# Patient Record
Sex: Female | Born: 1985 | Race: Black or African American | Hispanic: Refuse to answer | Marital: Married | Smoking: Never smoker
Health system: Southern US, Community
[De-identification: ages and names within clinical notes are randomized; demographics above are authoritative.]

## PROBLEM LIST (undated history)

## (undated) ENCOUNTER — Inpatient Hospital Stay (HOSPITAL_COMMUNITY): Payer: Self-pay

## (undated) DIAGNOSIS — L309 Dermatitis, unspecified: Secondary | ICD-10-CM

## (undated) DIAGNOSIS — K219 Gastro-esophageal reflux disease without esophagitis: Secondary | ICD-10-CM

## (undated) DIAGNOSIS — T7840XA Allergy, unspecified, initial encounter: Secondary | ICD-10-CM

## (undated) DIAGNOSIS — F419 Anxiety disorder, unspecified: Secondary | ICD-10-CM

## (undated) DIAGNOSIS — G43909 Migraine, unspecified, not intractable, without status migrainosus: Secondary | ICD-10-CM

## (undated) DIAGNOSIS — J302 Other seasonal allergic rhinitis: Secondary | ICD-10-CM

## (undated) HISTORY — DX: Other seasonal allergic rhinitis: J30.2

## (undated) HISTORY — DX: Allergy, unspecified, initial encounter: T78.40XA

## (undated) HISTORY — DX: Migraine, unspecified, not intractable, without status migrainosus: G43.909

## (undated) HISTORY — DX: Dermatitis, unspecified: L30.9

## (undated) HISTORY — DX: Anxiety disorder, unspecified: F41.9

## (undated) HISTORY — DX: Gastro-esophageal reflux disease without esophagitis: K21.9

---
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
## 2007-11-01 ENCOUNTER — Emergency Department (HOSPITAL_COMMUNITY): Admission: EM | Admit: 2007-11-01 | Discharge: 2007-11-01 | Payer: Self-pay | Admitting: Emergency Medicine

## 2008-08-07 ENCOUNTER — Emergency Department (HOSPITAL_COMMUNITY): Admission: EM | Admit: 2008-08-07 | Discharge: 2008-08-07 | Payer: Self-pay | Admitting: Family Medicine

## 2010-04-12 ENCOUNTER — Emergency Department (HOSPITAL_COMMUNITY): Admission: EM | Admit: 2010-04-12 | Discharge: 2010-04-12 | Payer: Self-pay | Admitting: Emergency Medicine

## 2011-02-10 NOTE — Op Note (Signed)
NAMEMEKENZIE, MODESTE NO.:  [id number]   MEDICAL RECORD NO.:  [id number]                   PATIENT TYPE:  JMCS   LOCATION:                                       FACILITY:  [facility]   PHYSICIAN:  Leonia Corona, M.D.               DATE OF BIRTH:  1986-04-05   DATE OF PROCEDURE:  08/19/2003  DATE OF DISCHARGE:                                 OPERATIVE REPORT   PREOPERATIVE DIAGNOSIS:  Bilateral supernumerary digits.   POSTOPERATIVE DIAGNOSIS:  Bilateral supernumerary digits.   OPERATION PERFORMED:  Excision of bilateral supernumerary rudimentary  digits.   SURGEON:  Leonia Corona, M.D.   ASSISTANT:  Nurse.   ANESTHESIA:  Topical plus local.   INDICATIONS FOR PROCEDURE:  This 25 year old female patient was seen for  bilateral rudimentary digits in both hands.  Hence the indication for the  procedure.   DESCRIPTION OF PROCEDURE:  The patient was brought to the operating room,  placed supine on the operating room table.  The topical EMLA cream was  applied at the base of both the rudimentary digits in both hands on the  outside of the palm.  Both hands were cleaned, prepped and draped in the  usual manner.  We started with the right hand first where approximately 0.5  mL of 1% lidocaine without epinephrine was infiltrated at the base of the  digit.  An elliptical incision at the base was made using knife and then  dissected with a sharp pointed scissors raising a skin flap on the proximal  side.  Once the digit was fairly mobilized and remained attached with the  core pedicle containing the vessels, hand cautery was used to cauterize and  remove the rudimentary digit from the field. The wound was inspected once  again, no active bleeder was noted.  The elliptical skin defect was closed  primarily using 6.0 Prolene interrupted sutures.  A sterile dressing was  applied.  We now turned our attention towards the left side where the hand  was already  cleaned, prepped and draped.  Approximately 0.5 mL of 1%  lidocaine was infiltrated at the base of the rudimentary digit.  A similar  elliptical incision was made with knife and a skin flap was raised on the  side of the palm and until the rudimentary digit appeared to be attached  only with a vascular pedicle, which was cauterized with __________  cautery  and the digit removed from the field.  No oozing and bleeding spot was  noted.  The skin defect was closed with 6-0 Prolene interrupted sutures.  A  sterile dressing was applied.  The patient tolerated the procedure well  which was smooth and uneventful.  The patient was later discharged to home  with follow-up schedule for suture removal.  Leonia Corona, M.D.    SF/MEDQ  D:  08/26/2003  T:  08/27/2003  Job:  [id number]

## 2011-04-05 ENCOUNTER — Inpatient Hospital Stay (INDEPENDENT_AMBULATORY_CARE_PROVIDER_SITE_OTHER)
Admission: RE | Admit: 2011-04-05 | Discharge: 2011-04-05 | Disposition: A | Discharge status: Home / Self Care | Payer: Self-pay | Source: Ambulatory Visit | Attending: Family Medicine | Admitting: Family Medicine

## 2011-04-05 DIAGNOSIS — J029 Acute pharyngitis, unspecified: Secondary | ICD-10-CM

## 2011-04-05 LAB — POCT RAPID STREP A: Streptococcus, Group A Screen (Direct): NEGATIVE

## 2013-01-28 ENCOUNTER — Encounter (HOSPITAL_COMMUNITY): Payer: Self-pay | Admitting: Emergency Medicine

## 2013-01-28 ENCOUNTER — Emergency Department (INDEPENDENT_AMBULATORY_CARE_PROVIDER_SITE_OTHER)
Admission: EM | Admit: 2013-01-28 | Discharge: 2013-01-28 | Disposition: A | Discharge status: Home / Self Care | Payer: No Typology Code available for payment source | Source: Home / Self Care

## 2013-01-28 ENCOUNTER — Emergency Department (INDEPENDENT_AMBULATORY_CARE_PROVIDER_SITE_OTHER): Payer: No Typology Code available for payment source

## 2013-01-28 DIAGNOSIS — J069 Acute upper respiratory infection, unspecified: Secondary | ICD-10-CM

## 2013-01-28 MED ORDER — FLUTICASONE PROPIONATE 50 MCG/ACT NA SUSP: 1.0000 | Freq: Two times a day (BID) | NASAL | Status: DC

## 2013-01-28 MED ORDER — AZITHROMYCIN 250 MG PO TABS: ORAL_TABLET | ORAL | Status: DC

## 2013-01-28 MED ORDER — HYDROCOD POLST-CHLORPHEN POLST 10-8 MG/5ML PO LQCR: 5.0000 mL | Freq: Two times a day (BID) | ORAL | Status: DC | PRN

## 2013-01-28 MED ORDER — TETANUS-DIPHTH-ACELL PERTUSSIS 5-2.5-18.5 LF-MCG/0.5 IM SUSP
INTRAMUSCULAR | Status: AC
  Filled 2013-01-28: qty 0.5

## 2013-01-28 MED ORDER — FEXOFENADINE HCL 180 MG PO TABS: 180.0000 mg | ORAL_TABLET | Freq: Every day | ORAL | Status: DC

## 2013-01-28 NOTE — ED Notes (Signed)
Onset of symptoms last week on Wednesday.  Initially had a sore throat, cough to follow.  Coughing is making it impossible to sleep at night.  Reports temp 101.  Reports sore throat, intermittent sharp pain in left ear.

## 2013-01-28 NOTE — ED Provider Notes (Signed)
History     CSN: [id number]  Arrival date & time 01/28/13  1616   None     Chief Complaint  Patient presents with  . Cough    (Consider location/radiation/quality/duration/timing/severity/associated sxs/prior treatment) Patient is a 27 y.o. female presenting with cough. The history is provided by the patient.  Cough Cough characteristics:  Hoarse, hacking and non-productive Severity:  Moderate Duration:  1 week Progression:  Unchanged Chronicity:  New Smoker: no   Context: upper respiratory infection   Ineffective treatments:  Cough suppressants Associated symptoms: fever, rhinorrhea and sore throat   Associated symptoms: no shortness of breath and no wheezing     History reviewed. No pertinent past medical history.  History reviewed. No pertinent past surgical history.  No family history on file.  History  Substance Use Topics  . Smoking status: Not on file  . Smokeless tobacco: Not on file  . Alcohol Use: Not on file    OB History   Grav Para Term Preterm Abortions TAB SAB Ect Mult Living                  Review of Systems  Constitutional: Positive for fever.  HENT: Positive for congestion, sore throat, rhinorrhea and postnasal drip.   Respiratory: Positive for cough. Negative for shortness of breath and wheezing.   Cardiovascular: Negative.   Gastrointestinal: Negative.     Allergies  Review of patient's allergies indicates not on file.  Home Medications   Current Outpatient Rx  Name  Route  Sig  Dispense  Refill  . azithromycin (ZITHROMAX Z-PAK) 250 MG tablet      Take as directed on pack   6 each   0   . chlorpheniramine-HYDROcodone (TUSSIONEX PENNKINETIC ER) 10-8 MG/5ML LQCR   Oral   Take 5 mLs by mouth every 12 (twelve) hours as needed.   115 mL   0   . fexofenadine (ALLEGRA) 180 MG tablet   Oral   Take 1 tablet (180 mg total) by mouth daily.   30 tablet   1   . fluticasone (FLONASE) 50 MCG/ACT nasal spray   Nasal   Place 1  spray into the nose 2 (two) times daily.   1 g   2     BP 118/85  Pulse 74  Temp(Src) 98.3 F (36.8 C) (Oral)  Resp 18  SpO2 97%  Physical Exam  Nursing note and vitals reviewed. Constitutional: She is oriented to person, place, and time. She appears well-developed and well-nourished. No distress.  HENT:  Head: Normocephalic.  Right Ear: External ear normal.  Left Ear: External ear normal.  Mouth/Throat: Oropharynx is clear and moist.  Eyes: Conjunctivae are normal. Pupils are equal, round, and reactive to light.  Neck: Normal range of motion. Neck supple.  Cardiovascular: Normal rate, regular rhythm, normal heart sounds and intact distal pulses.   Pulmonary/Chest: Effort normal and breath sounds normal.  Neurological: She is alert and oriented to person, place, and time.  Skin: Skin is warm and dry.    ED Course  Procedures (including critical care time)  Labs Reviewed - No data to display No results found.   1. URI (upper respiratory infection)       MDM  X-rays reviewed and report per radiologist.         Linna Hoff, MD 01/28/13 [phone]

## 2013-12-03 IMAGING — CR DG CHEST 2V
2 series · 2 of 2 positions shown · non-contrast
Comparison: None.

CLINICAL DATA: Cough, fever.

CHEST - 2 VIEW

[view not recorded (1 of 2)]
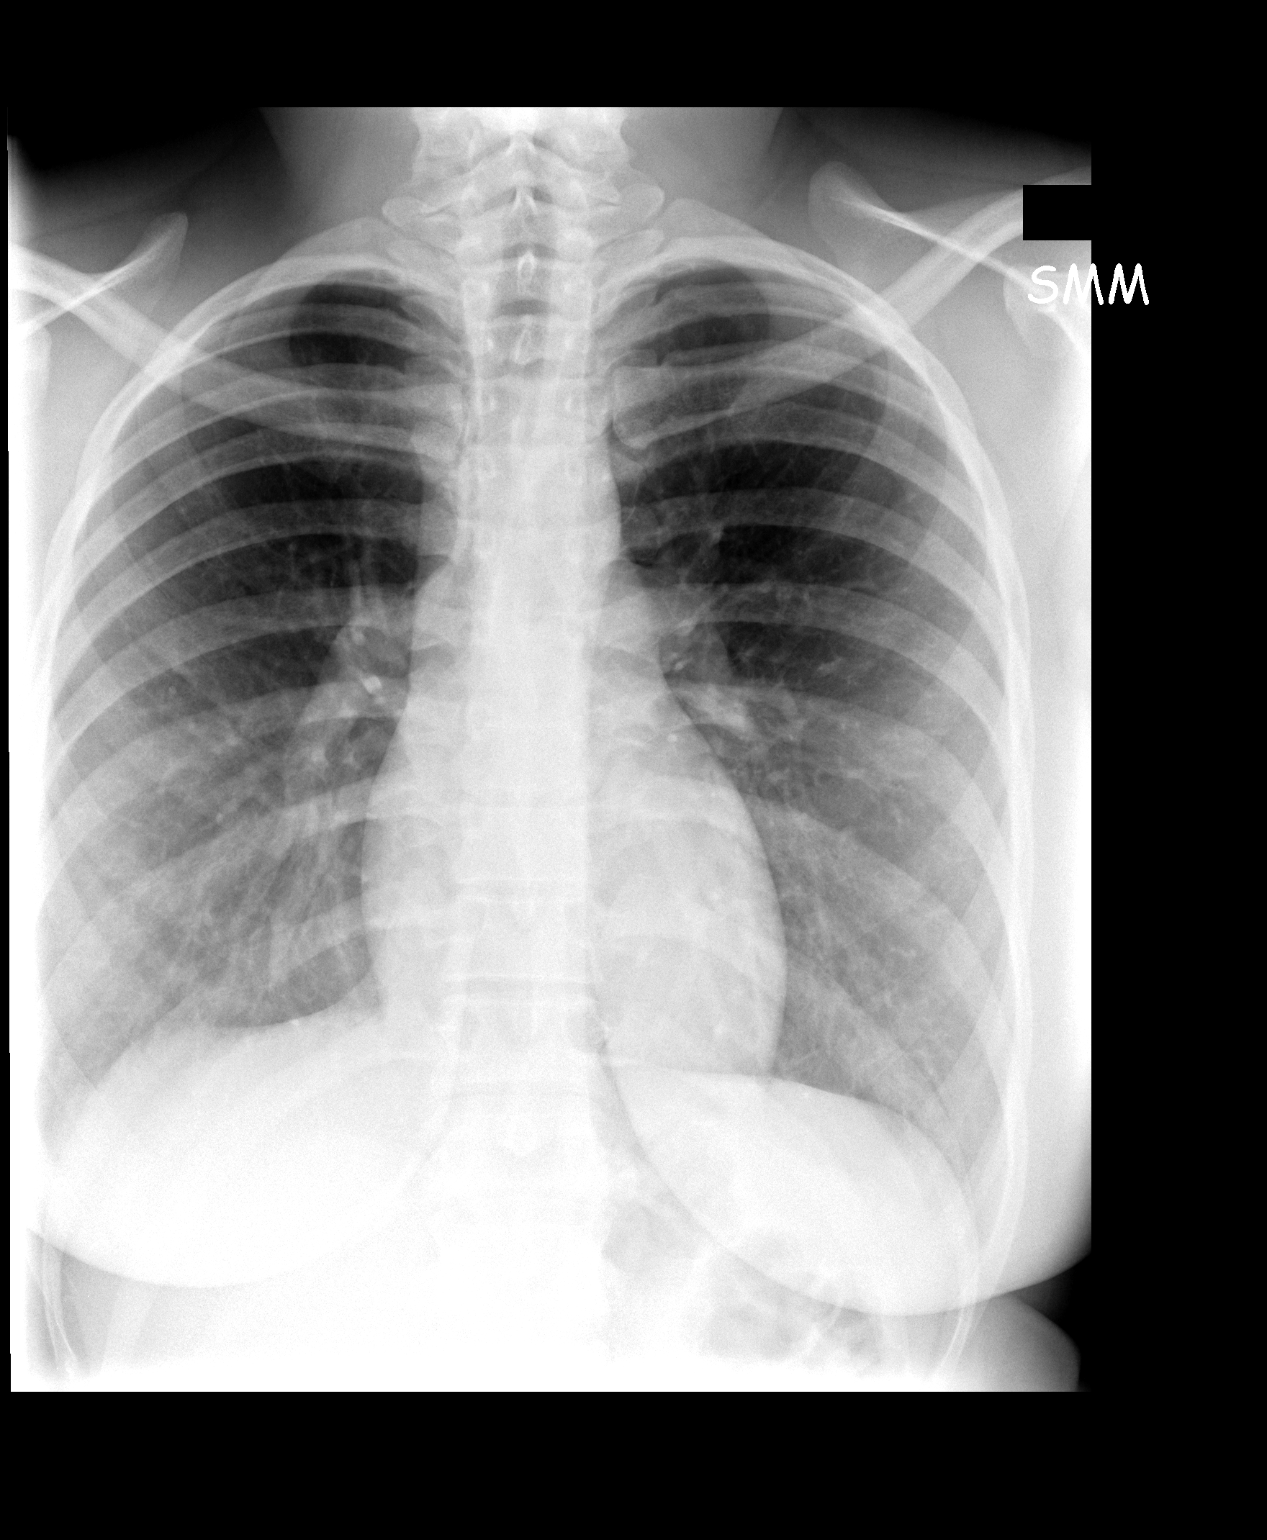

[view not recorded (2 of 2)]
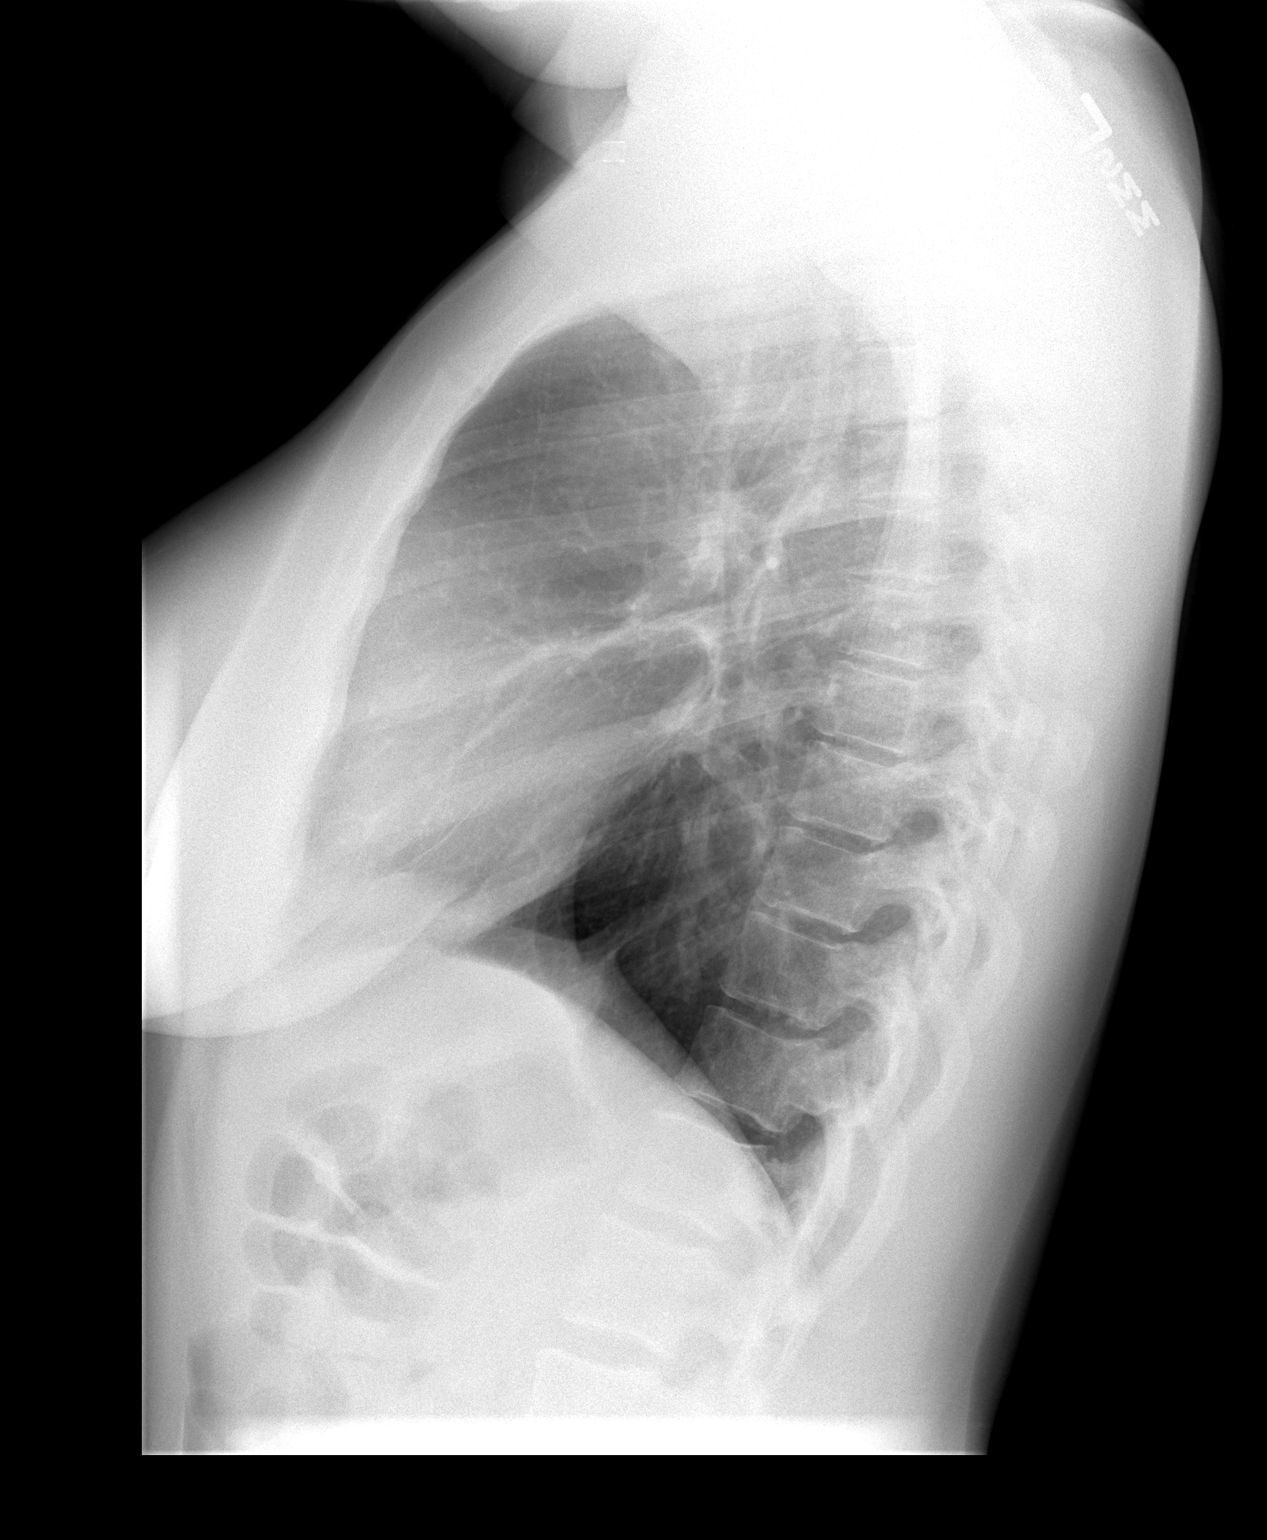

[2 of 2 positions shown; findings below may reference images not displayed]

FINDINGS: Mildly prominent bibasilar interstitial markings.  No
confluent airspace infiltrate or overt edema.  No effusion.  Heart
size normal.  Regional bones unremarkable.
IMPRESSION: 1.  Mild bibasilar bronchitic changes without focal infiltrate.

## 2014-01-14 ENCOUNTER — Encounter: Payer: Self-pay | Admitting: Neurology

## 2014-01-15 ENCOUNTER — Ambulatory Visit: Payer: No Typology Code available for payment source | Admitting: Neurology

## 2015-02-01 ENCOUNTER — Emergency Department (HOSPITAL_COMMUNITY)
Admission: EM | Admit: 2015-02-01 | Discharge: 2015-02-02 | Discharge status: Against Medical Advice | Payer: 59 | Attending: Emergency Medicine | Admitting: Emergency Medicine

## 2015-02-01 ENCOUNTER — Encounter (HOSPITAL_COMMUNITY): Payer: Self-pay | Admitting: Emergency Medicine

## 2015-02-01 ENCOUNTER — Emergency Department (HOSPITAL_COMMUNITY): Payer: 59

## 2015-02-01 DIAGNOSIS — R079 Chest pain, unspecified: Secondary | ICD-10-CM | POA: Insufficient documentation

## 2015-02-01 DIAGNOSIS — R200 Anesthesia of skin: Secondary | ICD-10-CM | POA: Insufficient documentation

## 2015-02-01 DIAGNOSIS — R0602 Shortness of breath: Secondary | ICD-10-CM | POA: Insufficient documentation

## 2015-02-01 LAB — CBC
HCT: 38.9 % (ref 36.0–46.0)
Hemoglobin: 13 g/dL (ref 12.0–15.0)
MCH: 31.6 pg (ref 26.0–34.0)
MCHC: 33.4 g/dL (ref 30.0–36.0)
MCV: 94.4 fL (ref 78.0–100.0)
PLATELETS: 255 10*3/uL (ref 150–400)
RBC: 4.12 MIL/uL (ref 3.87–5.11)
RDW: 12.7 % (ref 11.5–15.5)
WBC: 6.2 10*3/uL (ref 4.0–10.5)

## 2015-02-01 LAB — BASIC METABOLIC PANEL
Anion gap: 8 (ref 5–15)
BUN: 8 mg/dL (ref 6–20)
CALCIUM: 9.5 mg/dL (ref 8.9–10.3)
CO2: 26 mmol/L (ref 22–32)
Chloride: 103 mmol/L (ref 101–111)
Creatinine, Ser: 0.6 mg/dL (ref 0.44–1.00)
Glucose, Bld: 88 mg/dL (ref 70–99)
POTASSIUM: 3.4 mmol/L — AB (ref 3.5–5.1)
SODIUM: 137 mmol/L (ref 135–145)

## 2015-02-01 LAB — BRAIN NATRIURETIC PEPTIDE: B Natriuretic Peptide: 15.5 pg/mL (ref 0.0–100.0)

## 2015-02-01 LAB — POC URINE PREG, ED: Preg Test, Ur: NEGATIVE

## 2015-02-01 LAB — I-STAT TROPONIN, ED: Troponin i, poc: 0 ng/mL (ref 0.00–0.08)

## 2015-02-01 NOTE — ED Notes (Signed)
Pt c/o left arm pain and numbness onset 4 days ago, CP, SOB, left arm weakness, neck pain onset 0100 today, symptoms then went away and all returned at 1500 today.

## 2015-02-02 NOTE — ED Notes (Signed)
No answer when pt's name called in the lobby 

## 2015-02-02 NOTE — ED Notes (Signed)
No answer when pt's name called; pt eloped from lobby after triage

## 2015-02-02 NOTE — ED Notes (Signed)
Pt advised registration that she was leaving.  

## 2015-02-02 NOTE — ED Notes (Signed)
No answer when pt's name called in lobby, unable to locate pt

## 2015-02-03 ENCOUNTER — Emergency Department (HOSPITAL_COMMUNITY)
Admission: EM | Admit: 2015-02-03 | Discharge: 2015-02-03 | Disposition: A | Discharge status: Home / Self Care | Payer: 59 | Attending: Emergency Medicine | Admitting: Emergency Medicine

## 2015-02-03 ENCOUNTER — Encounter (HOSPITAL_COMMUNITY): Payer: Self-pay | Admitting: Emergency Medicine

## 2015-02-03 DIAGNOSIS — Z8709 Personal history of other diseases of the respiratory system: Secondary | ICD-10-CM | POA: Diagnosis not present

## 2015-02-03 DIAGNOSIS — M79602 Pain in left arm: Secondary | ICD-10-CM | POA: Diagnosis present

## 2015-02-03 DIAGNOSIS — Z8679 Personal history of other diseases of the circulatory system: Secondary | ICD-10-CM | POA: Insufficient documentation

## 2015-02-03 DIAGNOSIS — Z872 Personal history of diseases of the skin and subcutaneous tissue: Secondary | ICD-10-CM | POA: Insufficient documentation

## 2015-02-03 DIAGNOSIS — M62838 Other muscle spasm: Secondary | ICD-10-CM | POA: Diagnosis not present

## 2015-02-03 MED ORDER — DIAZEPAM 5 MG PO TABS: 5.0000 mg | ORAL_TABLET | Freq: Three times a day (TID) | ORAL | Status: DC | PRN

## 2015-02-03 NOTE — ED Notes (Signed)
Per pt, states left arm pain since Monday radiating up left neck

## 2015-02-03 NOTE — Discharge Instructions (Signed)

## 2015-02-03 NOTE — ED Provider Notes (Signed)
CSN: [id number]     Arrival date & time 02/03/15  1120 History   First MD Initiated Contact with Patient 02/03/15 1206     Chief Complaint  Patient presents with  . Arm Pain     (Consider location/radiation/quality/duration/timing/severity/associated sxs/prior Treatment) HPI Comments: Patient presents to the emergency department with chief complaint of left neck and upper back pain. States that she may have slept on her side wrong. Came to the ED yesterday, but ended up leaving secondary to long wait time. Returns this morning with persistent symptoms. The pain is worsened with movement and palpation. She denies any chest pain or shortness of breath. Denies any fevers chills. Denies any other known mechanism of injury. She states that the arm is painful, but not weak.  The history is provided by the patient. No language interpreter was used.    Past Medical History  Diagnosis Date  . Eczema   . Migraine   . Seasonal allergies    History reviewed. No pertinent past surgical history. Family History  Problem Relation Age of Onset  . Diabetes Mother   . Hypertension Mother   . Hypertension Maternal Grandmother   . Lupus Maternal Aunt   . Lymphoma Maternal Aunt    History  Substance Use Topics  . Smoking status: Never Smoker   . Smokeless tobacco: Not on file  . Alcohol Use: No   OB History    No data available     Review of Systems  Constitutional: Negative for fever and chills.  Respiratory: Negative for shortness of breath.   Cardiovascular: Negative for chest pain.  Gastrointestinal: Negative for nausea, vomiting, diarrhea and constipation.  Genitourinary: Negative for dysuria.  Musculoskeletal: Positive for myalgias and arthralgias.      Allergies  Coconut flavor; Mushroom extract complex; Olive oil; Valentino Saxonherry; and Mustard seed  Home Medications   Prior to Admission medications   Medication Sig Start Date End Date Taking? Authorizing Provider  acetaminophen  (TYLENOL) 500 MG tablet Take 1,000 mg by mouth every 4 (four) hours as needed for mild pain or headache.   Yes Historical Provider, MD  Menthol, Topical Analgesic, 4 % GEL Apply 1 application topically as needed (for pain).   Yes Historical Provider, MD  azithromycin (ZITHROMAX Z-PAK) 250 MG tablet Take as directed on pack Patient not taking: Reported on 02/01/2015 01/28/13   Linna HoffJames D Kindl, MD  chlorpheniramine-HYDROcodone [facility](TUSSIONEX PENNKINETIC ER) 10-8 MG/5ML LQCR Take 5 mLs by mouth every 12 (twelve) hours as needed. Patient not taking: Reported on 02/01/2015 01/28/13   Linna HoffJames D Kindl, MD  diazepam (VALIUM) 5 MG tablet Take 1 tablet (5 mg total) by mouth every 8 (eight) hours as needed for muscle spasms. 02/03/15   Roxy Horsemanobert Yuvraj Pfeifer, PA-C  fexofenadine (ALLEGRA) 180 MG tablet Take 1 tablet (180 mg total) by mouth daily. Patient not taking: Reported on 02/01/2015 01/28/13   Linna HoffJames D Kindl, MD  fluticasone [facility](FLONASE) 50 MCG/ACT nasal spray Place 1 spray into the nose 2 (two) times daily. Patient not taking: Reported on 02/01/2015 01/28/13   Linna HoffJames D Kindl, MD   BP 122/87 mmHg  Pulse 71  Temp(Src) 98.1 F (36.7 C) (Oral)  Resp 16  SpO2 100%  LMP 01/18/2015 Physical Exam  Constitutional: She is oriented to person, place, and time. She appears well-developed and well-nourished.  HENT:  Head: Normocephalic and atraumatic.  Eyes: Conjunctivae and EOM are normal.  Neck: Normal range of motion.  Cardiovascular: Normal rate and intact distal pulses.  Intact distal pulses  Pulmonary/Chest: Effort normal.  Abdominal: She exhibits no distension.  Musculoskeletal: Normal range of motion.  Left upper trapezius, and left cervical paraspinal muscles are tender to palpation, no bony abnormality or deformity of the shoulder or spine  Neurological: She is alert and oriented to person, place, and time.  Sensation intact  Skin: Skin is dry.  Psychiatric: She has a normal mood and affect. Her behavior is normal. Judgment  and thought content normal.  Nursing note and vitals reviewed.   ED Course  Procedures (including critical care time) Labs Review Labs Reviewed - No data to display  Imaging Review Dg Chest 2 View  02/01/2015   CLINICAL DATA:  Chest pain, shortness of breath  EXAM: CHEST  2 VIEW  COMPARISON:  01/28/2013  FINDINGS: Cardiomediastinal silhouette is stable. No acute infiltrate or pleural effusion. No pulmonary edema. Bilateral nipple metallic rings are noted.  IMPRESSION: No active cardiopulmonary disease.   Electronically Signed   By: Natasha MeadLiviu  Pop M.D.   On: 02/01/2015 16:49     EKG Interpretation None      MDM   Final diagnoses:  Muscle spasm    Patient with muscle spasm of left upper back. No mechanism of injury, other than sleeping on her side wrong. Will treat with Valium. Discharge to home with primary care/orthopedic follow-up.    Roxy Horsemanobert Halynn Reitano, PA-C 02/03/15 1331  Purvis SheffieldForrest Harrison, MD 02/04/15 [phone]

## 2016-01-13 ENCOUNTER — Inpatient Hospital Stay (HOSPITAL_COMMUNITY)
Admission: AD | Admit: 2016-01-13 | Discharge: 2016-01-13 | Disposition: A | Discharge status: Home / Self Care | Payer: Medicaid Other | Source: Ambulatory Visit | Attending: Obstetrics and Gynecology | Admitting: Obstetrics and Gynecology

## 2016-01-13 ENCOUNTER — Encounter (HOSPITAL_COMMUNITY): Payer: Self-pay | Admitting: *Deleted

## 2016-01-13 ENCOUNTER — Inpatient Hospital Stay (HOSPITAL_COMMUNITY): Payer: Medicaid Other

## 2016-01-13 DIAGNOSIS — Z3A08 8 weeks gestation of pregnancy: Secondary | ICD-10-CM | POA: Insufficient documentation

## 2016-01-13 DIAGNOSIS — O209 Hemorrhage in early pregnancy, unspecified: Secondary | ICD-10-CM | POA: Diagnosis not present

## 2016-01-13 DIAGNOSIS — O3680X0 Pregnancy with inconclusive fetal viability, not applicable or unspecified: Secondary | ICD-10-CM

## 2016-01-13 DIAGNOSIS — O4691 Antepartum hemorrhage, unspecified, first trimester: Secondary | ICD-10-CM

## 2016-01-13 DIAGNOSIS — O4690 Antepartum hemorrhage, unspecified, unspecified trimester: Secondary | ICD-10-CM

## 2016-01-13 DIAGNOSIS — N939 Abnormal uterine and vaginal bleeding, unspecified: Secondary | ICD-10-CM | POA: Diagnosis present

## 2016-01-13 LAB — CBC
HEMATOCRIT: 34.5 % — AB (ref 36.0–46.0)
Hemoglobin: 11.5 g/dL — ABNORMAL LOW (ref 12.0–15.0)
MCH: 30.2 pg (ref 26.0–34.0)
MCHC: 33.3 g/dL (ref 30.0–36.0)
MCV: 90.6 fL (ref 78.0–100.0)
PLATELETS: 273 10*3/uL (ref 150–400)
RBC: 3.81 MIL/uL — ABNORMAL LOW (ref 3.87–5.11)
RDW: 13.1 % (ref 11.5–15.5)
WBC: 7.4 10*3/uL (ref 4.0–10.5)

## 2016-01-13 LAB — URINALYSIS, ROUTINE W REFLEX MICROSCOPIC
BILIRUBIN URINE: NEGATIVE
GLUCOSE, UA: NEGATIVE mg/dL
Ketones, ur: NEGATIVE mg/dL
Nitrite: NEGATIVE
PROTEIN: NEGATIVE mg/dL
Specific Gravity, Urine: <1.005 — ABNORMAL LOW (ref 1.005–1.030)
pH: 6 (ref 5.0–8.0)

## 2016-01-13 LAB — URINE MICROSCOPIC-ADD ON

## 2016-01-13 LAB — WET PREP, GENITAL
CLUE CELLS WET PREP: NONE SEEN
SPERM: NONE SEEN
Trich, Wet Prep: NONE SEEN
YEAST WET PREP: NONE SEEN

## 2016-01-13 LAB — HCG, QUANTITATIVE, PREGNANCY: hCG, Beta Chain, Quant, S: 10218 m[IU]/mL — ABNORMAL HIGH (ref <5)

## 2016-01-13 LAB — ABO/RH: ABO/RH(D): A POS

## 2016-01-13 NOTE — Discharge Instructions (Signed)

## 2016-01-13 NOTE — MAU Note (Addendum)
Pt C/O spotting this week, went to MD yesterday, was told spotting was normal.  Noted dark red bleeding like she was starting her period around 1630 today.  Denies pain.  Was advised by MD office to come to MAU.

## 2016-01-13 NOTE — MAU Provider Note (Signed)
Chief Complaint: Vaginal Bleeding   First Provider Initiated Contact with Patient 01/13/16 1855    SUBJECTIVE HPI: Misty Lewis is a 30 y.o. G1P0 at [redacted]w[redacted]d by LMP who presents to [facility] Admissions reporting spotting since 01/09/16 and moderate dark red bleeding since 1630 this afternoon. [facility] [place] [facility] showing GS adn no FP per pt. Records not available. Pos UPT at [facility] yesterday. Pt was supposed to have F/U US today at [facility], but called [facility] when bleeding increased and was told to come to MAU for eval. Denies any bloodwork this pregnancy. None per Dr. Senaida Ores.   Vaginal bleeding Severity: Moderate Duration: 4 days Course: worsening Associated signs and symptoms: Neg for fever, chills, abdominal pain, passage of tissue or clots, dizziness.  Past Medical History  Diagnosis Date  . Eczema   . Migraine   . Seasonal allergies    OB History  Gravida Para Term Preterm AB SAB TAB Ectopic Multiple Living  1             # Outcome Date GA Lbr Len/2nd Weight Sex Delivery Anes PTL Lv  1 Current              History reviewed. No pertinent past surgical history. Social History   Social History  . Marital Status: Single    Spouse Name: N/A  . Number of Children: N/A  . Years of Education: N/A   Occupational History  . Not on file.   Social History Main Topics  . Smoking status: Never Smoker   . Smokeless tobacco: Not on file  . Alcohol Use: No  . Drug Use: No  . Sexual Activity: Not on file   Other Topics Concern  . Not on file   Social History Narrative   No current facility-administered medications on file prior to encounter.   Current Outpatient Prescriptions on File Prior to Encounter  Medication Sig Dispense Refill  . acetaminophen (TYLENOL) 500 MG tablet Take 1,000 mg by mouth every 4 (four) hours as needed for mild pain or headache.    Marland Kitchen azithromycin (ZITHROMAX Z-PAK) 250 MG tablet Take as  directed on pack (Patient not taking: Reported on 02/01/2015) 6 each 0  . chlorpheniramine-HYDROcodone (TUSSIONEX PENNKINETIC ER) 10-8 MG/5ML LQCR Take 5 mLs by mouth every 12 (twelve) hours as needed. (Patient not taking: Reported on 02/01/2015) 115 mL 0  . diazepam (VALIUM) 5 MG tablet Take 1 tablet (5 mg total) by mouth every 8 (eight) hours as needed for muscle spasms. 5 tablet 0  . fexofenadine (ALLEGRA) 180 MG tablet Take 1 tablet (180 mg total) by mouth daily. (Patient not taking: Reported on 02/01/2015) 30 tablet 1  . fluticasone (FLONASE) 50 MCG/ACT nasal spray Place 1 spray into the nose 2 (two) times daily. (Patient not taking: Reported on 02/01/2015) 1 g 2  . Menthol, Topical Analgesic, 4 % GEL Apply 1 application topically as needed (for pain).     Allergies  Allergen Reactions  . Coconut Flavor Anaphylaxis and Nausea And Vomiting  . Mushroom Extract Complex Anaphylaxis and Nausea And Vomiting  . Olive Oil Anaphylaxis and Nausea And Vomiting  . Cherry Nausea And Vomiting  . Mustard Seed Nausea And Vomiting    I have reviewed the past Medical Hx, Surgical Hx, Social Hx, Allergies and Medications.   Review of Systems  Constitutional: Negative for fever and chills.  Gastrointestinal: Negative for abdominal pain.  Genitourinary: Positive for vaginal bleeding.  Negative for dysuria, hematuria, vaginal discharge, vaginal pain and pelvic pain.  Musculoskeletal: Negative for back pain.  Neurological: Negative for dizziness.    OBJECTIVE Patient Vitals for the past 24 hrs:  BP Temp Temp src Pulse Resp Height Weight  01/13/16 1741 124/74 mmHg 98 F (36.7 C) Oral 90 16 5\' 5"  (1.651 m) 201 lb (91.173 kg)   Constitutional: Well-developed, well-nourished female in no acute distress. No pallor. Cardiovascular: normal rate Respiratory: normal rate and effort.  GI: Abd soft, non-tender.  MS: Extremities nontender, no edema, normal ROM Neurologic: Alert and oriented x 4.  GU:  SPECULUM EXAM:  NEFG, moderate amount of dark red blood noted, cervix clean, but incompletely visualized. No Polyp seen or palpated.  BIMANUAL: cervix closed; uterus slightly enlarged, no adnexal tenderness or masses. No CMT.  LAB RESULTS Results for orders placed or performed during the hospital encounter of 01/13/16 (from the past 24 hour(s))  Urinalysis, Routine w reflex microscopic ([facility])     Status: Abnormal   Collection Time: 01/13/16  5:45 PM  Result Value Ref Range   Color, Urine YELLOW YELLOW   APPearance CLEAR CLEAR   Specific Gravity, Urine <1.005 (L) 1.005 - 1.030   pH 6.0 5.0 - 8.0   Glucose, UA NEGATIVE NEGATIVE mg/dL   Hgb urine dipstick MODERATE (A) NEGATIVE   Bilirubin Urine NEGATIVE NEGATIVE   Ketones, ur NEGATIVE NEGATIVE mg/dL   Protein, ur NEGATIVE NEGATIVE mg/dL   Nitrite NEGATIVE NEGATIVE   Leukocytes, UA TRACE (A) NEGATIVE  Urine microscopic-add on     Status: Abnormal   Collection Time: 01/13/16  5:45 PM  Result Value Ref Range   Squamous Epithelial / LPF 0-5 (A) NONE SEEN   WBC, UA 0-5 0 - 5 WBC/hpf   RBC / HPF 0-5 0 - 5 RBC/hpf   Bacteria, UA RARE (A) NONE SEEN  CBC     Status: Abnormal   Collection Time: 01/13/16  6:50 PM  Result Value Ref Range   WBC 7.4 4.0 - 10.5 K/uL   RBC 3.81 (L) 3.87 - 5.11 MIL/uL   Hemoglobin 11.5 (L) 12.0 - 15.0 g/dL   HCT 16.134.5 (L) 09.636.0 - 04.546.0 %   MCV 90.6 78.0 - 100.0 fL   MCH 30.2 26.0 - 34.0 pg   MCHC 33.3 30.0 - 36.0 g/dL   RDW 40.913.1 81.111.5 - 91.415.5 %   Platelets 273 150 - 400 K/uL  hCG, quantitative, pregnancy     Status: Abnormal   Collection Time: 01/13/16  6:50 PM  Result Value Ref Range   hCG, Beta Chain, Quant, S 10218 (H) <5 mIU/mL  ABO/Rh     Status: None   Collection Time: 01/13/16  6:50 PM  Result Value Ref Range   ABO/RH(D) A POS   Wet prep, genital     Status: Abnormal   Collection Time: 01/13/16  7:43 PM  Result Value Ref Range   Yeast Wet Prep HPF POC NONE SEEN NONE SEEN   Trich, Wet Prep NONE SEEN NONE SEEN    Clue Cells Wet Prep HPF POC NONE SEEN NONE SEEN   WBC, Wet Prep HPF POC FEW (A) NONE SEEN   Sperm NONE SEEN     IMAGING [place] Ob Comp Less 14 Wks  01/13/2016  CLINICAL DATA:  Vaginal bleeding and first-trimester pregnancy. Report of outside ultrasound with gestational age of [redacted] weeks 3 days. EXAM: OBSTETRIC <14 WK US AND TRANSVAGINAL OB US TECHNIQUE: Both transabdominal and transvaginal ultrasound  examinations were performed for complete evaluation of the gestation as well as the maternal uterus, adnexal regions, and pelvic cul-de-sac. Transvaginal technique was performed to assess early pregnancy. COMPARISON:  None. FINDINGS: Intrauterine gestational sac: Present Yolk sac:  Few internal echoes but no normal yolk sac. Embryo:  None. MSD: 10.8  mm   5 w   6  d Subchorionic hemorrhage:  None visualized. Maternal uterus/adnexae: Mass contiguous with the right fundus shows heterogeneous echotexture and measures up to 33 mm. Bridging veins were not demonstrated on this scan. IMPRESSION: 1. Intrauterine gestational sac measuring 5 weeks 6 days, discordant with reported dates by outside ultrasound. No internal yolk sac or fetal pole is seen. No subchorionic hematoma. 2. 3 cm right pelvic mass, favor pedunculated fibroid. Electronically Signed   By: Marnee Spring M.D.   On: 01/13/2016 19:43   US Ob Transvaginal  01/13/2016  CLINICAL DATA:  Vaginal bleeding and first-trimester pregnancy. Report of outside ultrasound with gestational age of [redacted] weeks 3 days. EXAM: OBSTETRIC <14 WK [place] AND TRANSVAGINAL OB US TECHNIQUE: Both transabdominal and transvaginal ultrasound examinations were performed for complete evaluation of the gestation as well as the maternal uterus, adnexal regions, and pelvic cul-de-sac. Transvaginal technique was performed to assess early pregnancy. COMPARISON:  None. FINDINGS: Intrauterine gestational sac: Present Yolk sac:  Few internal echoes but no normal yolk sac. Embryo:  None. MSD: 10.8  mm   5  w   6  d Subchorionic hemorrhage:  None visualized. Maternal uterus/adnexae: Mass contiguous with the right fundus shows heterogeneous echotexture and measures up to 33 mm. Bridging veins were not demonstrated on this scan. IMPRESSION: 1. Intrauterine gestational sac measuring 5 weeks 6 days, discordant with reported dates by outside ultrasound. No internal yolk sac or fetal pole is seen. No subchorionic hematoma. 2. 3 cm right pelvic mass, favor pedunculated fibroid. Electronically Signed   By: Marnee Spring M.D.   On: 01/13/2016 19:43    MAU COURSE CBC, Quant, ABO/Rh, ultrasound, wet prep and GC/chlamydia culture, UA.  Discussed Hx exam, lbs, [place] w/ Dr. Senaida Ores. Reviewed [facility]. Does not have [place] reports. Agrees w/ POC.   MDM - Bleeding in early pregnancy with pregnancy of unknown anatomic location, but hemodynamically stable. Concern for anembryonic pregnancy, but without previous US for comparison and no YS seen w/ certainty we will need to proceed w/ R/O ectopic work-up.   ASSESSMENT 1. Vaginal bleeding in pregnancy   2. Pregnancy of unknown anatomic location     PLAN Discharge home in stable condition per consult w/ Dr. Senaida Ores. SAB, ectopic precautions   Follow-up Information    Follow up with [facility] On 01/15/2016.   Why:  For repeat blood work or sooner as needed in emergencies (heavy bleeding, severe pain or fever greater than 100.4)   Contact information:   [address] [phone] mc [place] [address] [phone]      Follow up with [facility] On 01/18/2016.   Contact information:   [address]  [address] [place] [address] [phone]         Medication List    STOP taking these medications        acetaminophen 500 MG tablet  Commonly known as:  TYLENOL     azithromycin 250 MG tablet  Commonly known as:  ZITHROMAX Z-PAK      chlorpheniramine-HYDROcodone 10-8 MG/5ML Lqcr  Commonly known as:  TUSSIONEX PENNKINETIC  ER     diazepam 5 MG tablet  Commonly known as:  VALIUM     fexofenadine 180 MG tablet  Commonly known as:  ALLEGRA     fluticasone 50 MCG/ACT nasal spray  Commonly known as:  FLONASE     Menthol (Topical Analgesic) 4 % Gel      ASK your doctor about these medications        prenatal multivitamin Tabs tablet  Take 1 tablet by mouth 3 (three) times daily.       [place], CNM 01/13/2016  8:42 PM  4

## 2016-01-14 LAB — GC/CHLAMYDIA PROBE AMP (~~LOC~~) NOT AT ARMC
Chlamydia: NEGATIVE
Neisseria Gonorrhea: NEGATIVE

## 2016-01-15 ENCOUNTER — Inpatient Hospital Stay (HOSPITAL_COMMUNITY)
Admission: AD | Admit: 2016-01-15 | Discharge: 2016-01-15 | Disposition: A | Discharge status: Home / Self Care | Payer: Medicaid Other | Source: Ambulatory Visit | Attending: Obstetrics and Gynecology | Admitting: Obstetrics and Gynecology

## 2016-01-15 DIAGNOSIS — Z3A09 9 weeks gestation of pregnancy: Secondary | ICD-10-CM | POA: Diagnosis not present

## 2016-01-15 DIAGNOSIS — O209 Hemorrhage in early pregnancy, unspecified: Secondary | ICD-10-CM | POA: Diagnosis not present

## 2016-01-15 LAB — HCG, QUANTITATIVE, PREGNANCY: HCG, BETA CHAIN, QUANT, S: 10289 m[IU]/mL — AB (ref <5)

## 2016-01-15 NOTE — MAU Note (Signed)
Still having a little bit of spotting, no cramping, here for follow up [facility]

## 2016-01-15 NOTE — MAU Provider Note (Signed)
MAU HISTORY AND PHYSICAL  Chief Complaint:  Follow-up   Misty Lewis is a 30 y.o.  G1P0 with IUP at 3875w0d presenting for Follow-up  Here 2 days ago with spotting. No pain. U/s and hcg at that time. F/u today. Still mild spotting. No pain. No fever. No passing of products.  Past Medical History  Diagnosis Date  . Eczema   . Migraine   . Seasonal allergies     No past surgical history on file.  Family History  Problem Relation Age of Onset  . Diabetes Mother   . Hypertension Mother   . Hypertension Maternal Grandmother   . Lupus Maternal Aunt   . Lymphoma Maternal Aunt     Social History  Substance Use Topics  . Smoking status: Never Smoker   . Smokeless tobacco: Not on file  . Alcohol Use: No    Allergies  Allergen Reactions  . Coconut Flavor Anaphylaxis and Nausea And Vomiting  . Mushroom Extract Complex Anaphylaxis and Nausea And Vomiting  . Olive Oil Anaphylaxis and Nausea And Vomiting  . Cherry Nausea And Vomiting  . Mustard Seed Nausea And Vomiting    Prescriptions prior to admission  Medication Sig Dispense Refill Last Dose  . Prenatal Vit-Fe Fumarate-FA (PRENATAL MULTIVITAMIN) TABS tablet Take 1 tablet by mouth 3 (three) times daily.   01/13/2016 at Unknown time    Review of Systems - Negative except for what is mentioned in HPI.  Physical Exam  Blood pressure 128/74, pulse 83, temperature 98.2 F (36.8 C), resp. rate 18, height 5\' 5"  (1.651 m), weight 201 lb (91.173 kg), last menstrual period 11/13/2015. GENERAL: Well-developed, well-nourished female in no acute distress.  LUNGS: Clear to auscultation bilaterally.  HEART: Regular rate and rhythm. ABDOMEN: Soft, nontender, nondistended, gravid.  EXTREMITIES: Nontender, no edema, 2+ distal pulses.    Labs: Results for orders placed or performed during the hospital encounter of 01/15/16 (from the past 24 hour(s))  hCG, quantitative, pregnancy   Collection Time: 01/15/16  7:05 PM  Result Value  Ref Range   hCG, Beta Chain, Quant, S 10289 (H) <5 mIU/mL    Imaging Studies:  [place] Ob Comp Less 14 Wks  01/13/2016  CLINICAL DATA:  Vaginal bleeding and first-trimester pregnancy. Report of outside ultrasound with gestational age of [redacted] weeks 3 days. EXAM: OBSTETRIC <14 WK US AND TRANSVAGINAL OB US TECHNIQUE: Both transabdominal and transvaginal ultrasound examinations were performed for complete evaluation of the gestation as well as the maternal uterus, adnexal regions, and pelvic cul-de-sac. Transvaginal technique was performed to assess early pregnancy. COMPARISON:  None. FINDINGS: Intrauterine gestational sac: Present Yolk sac:  Few internal echoes but no normal yolk sac. Embryo:  None. MSD: 10.8  mm   5 w   6  d Subchorionic hemorrhage:  None visualized. Maternal uterus/adnexae: Mass contiguous with the right fundus shows heterogeneous echotexture and measures up to 33 mm. Bridging veins were not demonstrated on this scan. IMPRESSION: 1. Intrauterine gestational sac measuring 5 weeks 6 days, discordant with reported dates by outside ultrasound. No internal yolk sac or fetal pole is seen. No subchorionic hematoma. 2. 3 cm right pelvic mass, favor pedunculated fibroid. Electronically Signed   By: Marnee SpringJonathon  Watts M.D.   On: 01/13/2016 19:43   [place] Ob Transvaginal  01/13/2016  CLINICAL DATA:  Vaginal bleeding and first-trimester pregnancy. Report of outside ultrasound with gestational age of [redacted] weeks 3 days. EXAM: OBSTETRIC <14 WK US AND TRANSVAGINAL OB US TECHNIQUE: Both transabdominal and  transvaginal ultrasound examinations were performed for complete evaluation of the gestation as well as the maternal uterus, adnexal regions, and pelvic cul-de-sac. Transvaginal technique was performed to assess early pregnancy. COMPARISON:  None. FINDINGS: Intrauterine gestational sac: Present Yolk sac:  Few internal echoes but no normal yolk sac. Embryo:  None. MSD: 10.8  mm   5 w   6  d Subchorionic hemorrhage:  None  visualized. Maternal uterus/adnexae: Mass contiguous with the right fundus shows heterogeneous echotexture and measures up to 33 mm. Bridging veins were not demonstrated on this scan. IMPRESSION: 1. Intrauterine gestational sac measuring 5 weeks 6 days, discordant with reported dates by outside ultrasound. No internal yolk sac or fetal pole is seen. No subchorionic hematoma. 2. 3 cm right pelvic mass, favor pedunculated fibroid. Electronically Signed   By: Marnee Spring M.D.   On: 01/13/2016 19:43    Assessment/plan: Misty Lewis is  30 y.o. G1P0 at [redacted]w[redacted]d presents for f/u first tri bleeding. HCG essentially unchanged from 2 days ago. U/s at that time showed probable iup. Rh positive. Called & discussed w/ Dr. Ellyn Hack. Very likely failed pregnancy. Discussed expectant mgmt vs cytotec with patient and she strongly prefers the former. Per Dr. Ellyn Hack pt will f/u early this week. Patient has appt on Tuesday so I told her to make that appointment. Bleeding, pain, and infection return precautions discussed.   Silvano Bilis 4/22/20179:15 PM

## 2016-01-15 NOTE — Discharge Instructions (Signed)

## 2016-01-15 NOTE — Progress Notes (Signed)
Pt d/c home from Triage after Dr Ashok PallWouk spoke with her. Will f/u in clinic next wk

## 2016-01-15 NOTE — MAU Note (Signed)
Dr Ashok PallWouk in Triage to discuss lab results and plan of care with pt

## 2016-01-17 NOTE — MAU Provider Note (Signed)
MAU HISTORY AND PHYSICAL  Chief Complaint:  Follow-up   Misty Lewis is a 30 y.o.  G1P0 with IUP at 2945w0d presenting for Follow-up  Here 2 days ago with spotting. No pain. U/s and hcg at that time. F/u today. Still mild spotting. No pain. No fever. No passing of products.  Past Medical History  Diagnosis Date  . Eczema   . Migraine   . Seasonal allergies     No past surgical history on file.  Family History  Problem Relation Age of Onset  . Diabetes Mother   . Hypertension Mother   . Hypertension Maternal Grandmother   . Lupus Maternal Aunt   . Lymphoma Maternal Aunt     Social History  Substance Use Topics  . Smoking status: Never Smoker   . Smokeless tobacco: Not on file  . Alcohol Use: No    Allergies  Allergen Reactions  . Coconut Flavor Anaphylaxis and Nausea And Vomiting  . Mushroom Extract Complex Anaphylaxis and Nausea And Vomiting  . Olive Oil Anaphylaxis and Nausea And Vomiting  . Cherry Nausea And Vomiting  . Mustard Seed Nausea And Vomiting    No prescriptions prior to admission    Review of Systems - Negative except for what is mentioned in HPI.  Physical Exam  Blood pressure 128/74, pulse 83, temperature 98.2 F (36.8 C), resp. rate 18, height 5\' 5"  (1.651 m), weight 91.173 kg (201 lb), last menstrual period 11/13/2015. GENERAL: Well-developed, well-nourished female in no acute distress.  LUNGS: Clear to auscultation bilaterally.  HEART: Regular rate and rhythm. ABDOMEN: Soft, nontender, nondistended, gravid.  EXTREMITIES: Nontender, no edema, 2+ distal pulses.    Labs: No results found for this or any previous visit (from the past 24 hour(s)).  Imaging Studies:  [place] Ob Comp Less 14 Wks  01/13/2016  CLINICAL DATA:  Vaginal bleeding and first-trimester pregnancy. Report of outside ultrasound with gestational age of [redacted] weeks 3 days. EXAM: OBSTETRIC <14 WK US AND TRANSVAGINAL OB US TECHNIQUE: Both transabdominal and transvaginal  ultrasound examinations were performed for complete evaluation of the gestation as well as the maternal uterus, adnexal regions, and pelvic cul-de-sac. Transvaginal technique was performed to assess early pregnancy. COMPARISON:  None. FINDINGS: Intrauterine gestational sac: Present Yolk sac:  Few internal echoes but no normal yolk sac. Embryo:  None. MSD: 10.8  mm   5 w   6  d Subchorionic hemorrhage:  None visualized. Maternal uterus/adnexae: Mass contiguous with the right fundus shows heterogeneous echotexture and measures up to 33 mm. Bridging veins were not demonstrated on this scan. IMPRESSION: 1. Intrauterine gestational sac measuring 5 weeks 6 days, discordant with reported dates by outside ultrasound. No internal yolk sac or fetal pole is seen. No subchorionic hematoma. 2. 3 cm right pelvic mass, favor pedunculated fibroid. Electronically Signed   By: Marnee SpringJonathon  Watts M.D.   On: 01/13/2016 19:43   [place] Ob Transvaginal  01/13/2016  CLINICAL DATA:  Vaginal bleeding and first-trimester pregnancy. Report of outside ultrasound with gestational age of [redacted] weeks 3 days. EXAM: OBSTETRIC <14 WK US AND TRANSVAGINAL OB US TECHNIQUE: Both transabdominal and transvaginal ultrasound examinations were performed for complete evaluation of the gestation as well as the maternal uterus, adnexal regions, and pelvic cul-de-sac. Transvaginal technique was performed to assess early pregnancy. COMPARISON:  None. FINDINGS: Intrauterine gestational sac: Present Yolk sac:  Few internal echoes but no normal yolk sac. Embryo:  None. MSD: 10.8  mm   5 w   6  d Subchorionic hemorrhage:  None visualized. Maternal uterus/adnexae: Mass contiguous with the right fundus shows heterogeneous echotexture and measures up to 33 mm. Bridging veins were not demonstrated on this scan. IMPRESSION: 1. Intrauterine gestational sac measuring 5 weeks 6 days, discordant with reported dates by outside ultrasound. No internal yolk sac or fetal pole is seen. No  subchorionic hematoma. 2. 3 cm right pelvic mass, favor pedunculated fibroid. Electronically Signed   By: Marnee Spring M.D.   On: 01/13/2016 19:43    Assessment/plan: Misty Lewis is  30 y.o. G1P0 at [redacted]w[redacted]d presents for f/u first tri bleeding. HCG essentially unchanged from 2 days ago. U/s at that time showed probable iup. Rh positive. Called & discussed w/ Dr. Ellyn Hack. Very likely failed pregnancy. Discussed expectant mgmt vs cytotec with patient and she strongly prefers the former. Per Dr. Ellyn Hack pt will f/u early this week. Patient has appt on Tuesday so I told her to make that appointment. Bleeding, pain, and infection return precautions discussed.   Bovard-Stuckert, Quinlan Vollmer 4/24/201711:37 AM MAU HISTORY AND PHYSICAL  Chief Complaint:  Follow-up   Misty Lewis is a 30 y.o.  G1P0 with IUP at [redacted]w[redacted]d presenting for Follow-up  Here 2 days ago with spotting. No pain. U/s and hcg at that time. F/u today. Still mild spotting. No pain. No fever. No passing of products.  Past Medical History  Diagnosis Date  . Eczema   . Migraine   . Seasonal allergies     No past surgical history on file.  Family History  Problem Relation Age of Onset  . Diabetes Mother   . Hypertension Mother   . Hypertension Maternal Grandmother   . Lupus Maternal Aunt   . Lymphoma Maternal Aunt     Social History  Substance Use Topics  . Smoking status: Never Smoker   . Smokeless tobacco: Not on file  . Alcohol Use: No    Allergies  Allergen Reactions  . Coconut Flavor Anaphylaxis and Nausea And Vomiting  . Mushroom Extract Complex Anaphylaxis and Nausea And Vomiting  . Olive Oil Anaphylaxis and Nausea And Vomiting  . Cherry Nausea And Vomiting  . Mustard Seed Nausea And Vomiting    No prescriptions prior to admission    Review of Systems - Negative except for what is mentioned in HPI.  Physical Exam  Blood pressure 128/74, pulse 83, temperature 98.2 F (36.8 C), resp. rate 18,  height  (1.651 m), weight 91.173 kg (201 lb), last menstrual period 11/13/2015. GENERAL: Well-developed, well-nourished female in no acute distress.  LUNGS: Clear to auscultation bilaterally.  HEART: Regular rate and rhythm. ABDOMEN: Soft, nontender, nondistended, gravid.  EXTREMITIES: Nontender, no edema, 2+ distal pulses.    Labs: No results found for this or any previous visit (from the past 24 hour(s)).  Imaging Studies:  [place] Ob Comp Less 14 Wks  01/13/2016  CLINICAL DATA:  Vaginal bleeding and first-trimester pregnancy. Report of outside ultrasound with gestational age of [redacted] weeks 3 days. EXAM: OBSTETRIC <14 WK [place] AND TRANSVAGINAL OB US TECHNIQUE: Both transabdominal and transvaginal ultrasound examinations were performed for complete evaluation of the gestation as well as the maternal uterus, adnexal regions, and pelvic cul-de-sac. Transvaginal technique was performed to assess early pregnancy. COMPARISON:  None. FINDINGS: Intrauterine gestational sac: Present Yolk sac:  Few internal echoes but no normal yolk sac. Embryo:  None. MSD: 10.8  mm   5 w   6  d Subchorionic hemorrhage:  None visualized. Maternal uterus/adnexae: Mass  contiguous with the right fundus shows heterogeneous echotexture and measures up to 33 mm. Bridging veins were not demonstrated on this scan. IMPRESSION: 1. Intrauterine gestational sac measuring 5 weeks 6 days, discordant with reported dates by outside ultrasound. No internal yolk sac or fetal pole is seen. No subchorionic hematoma. 2. 3 cm right pelvic mass, favor pedunculated fibroid. Electronically Signed   By: Marnee Spring M.D.   On: 01/13/2016 19:43   [place] Ob Transvaginal  01/13/2016  CLINICAL DATA:  Vaginal bleeding and first-trimester pregnancy. Report of outside ultrasound with gestational age of [redacted] weeks 3 days. EXAM: OBSTETRIC <14 WK [place] AND TRANSVAGINAL OB US TECHNIQUE: Both transabdominal and transvaginal ultrasound examinations were performed for complete  evaluation of the gestation as well as the maternal uterus, adnexal regions, and pelvic cul-de-sac. Transvaginal technique was performed to assess early pregnancy. COMPARISON:  None. FINDINGS: Intrauterine gestational sac: Present Yolk sac:  Few internal echoes but no normal yolk sac. Embryo:  None. MSD: 10.8  mm   5 w   6  d Subchorionic hemorrhage:  None visualized. Maternal uterus/adnexae: Mass contiguous with the right fundus shows heterogeneous echotexture and measures up to 33 mm. Bridging veins were not demonstrated on this scan. IMPRESSION: 1. Intrauterine gestational sac measuring 5 weeks 6 days, discordant with reported dates by outside ultrasound. No internal yolk sac or fetal pole is seen. No subchorionic hematoma. 2. 3 cm right pelvic mass, favor pedunculated fibroid. Electronically Signed   By: Marnee Spring M.D.   On: 01/13/2016 19:43    Assessment/plan: Misty Lewis is  30 y.o. G1P0 at [redacted]w[redacted]d presents for f/u first tri bleeding. HCG essentially unchanged from 2 days ago. U/s at that time showed probable iup. Rh positive. Called & discussed w/ Dr. Ellyn Hack. Very likely failed pregnancy. Discussed expectant mgmt vs cytotec with patient and she strongly prefers the former. Per Dr. Ellyn Hack pt will f/u early this week. Patient has appt on Tuesday so I told her to make that appointment. Bleeding, pain, and infection return precautions discussed.   Bovard-Stuckert, Brodan Grewell 4/24/201711:37 AM

## 2016-09-25 NOTE — L&D Delivery Note (Signed)
Delivery Note At 11:00 PM a viable female was delivered via Vaginal, Spontaneous Delivery (Presentation: vtx; LOA ).  APGAR: 8, 9; weight  pending.   Placenta status: spontaneous, intact.  Cord:  with the following complications: nuchal x 1, delivered through.   Anesthesia:  Local for repair   Episiotomy: None Lacerations: 2nd degree;Labial Suture Repair: 2.0 vicryl rapide and 3-0 Vicryl rapide Est. Blood Loss (mL): 300  Mom to postpartum.  Baby to Couplet care / Skin to Skin.  Leighton Roachodd D Neko Mcgeehan 05/01/2017, 11:37 PM

## 2016-10-05 LAB — OB RESULTS CONSOLE ABO/RH: RH TYPE: POSITIVE

## 2016-10-05 LAB — OB RESULTS CONSOLE RPR: RPR: NONREACTIVE

## 2016-10-05 LAB — OB RESULTS CONSOLE RUBELLA ANTIBODY, IGM: Rubella: IMMUNE

## 2016-10-05 LAB — OB RESULTS CONSOLE GC/CHLAMYDIA
CHLAMYDIA, DNA PROBE: NEGATIVE
Gonorrhea: NEGATIVE

## 2016-10-05 LAB — OB RESULTS CONSOLE ANTIBODY SCREEN: Antibody Screen: NEGATIVE

## 2016-10-05 LAB — OB RESULTS CONSOLE HEPATITIS B SURFACE ANTIGEN: HEP B S AG: NEGATIVE

## 2016-10-05 LAB — OB RESULTS CONSOLE HIV ANTIBODY (ROUTINE TESTING): HIV: NONREACTIVE

## 2016-11-17 ENCOUNTER — Inpatient Hospital Stay (HOSPITAL_COMMUNITY): Payer: Managed Care, Other (non HMO)

## 2016-11-17 ENCOUNTER — Inpatient Hospital Stay (HOSPITAL_COMMUNITY)
Admission: AD | Admit: 2016-11-17 | Discharge: 2016-11-17 | Disposition: A | Discharge status: Home / Self Care | Payer: Managed Care, Other (non HMO) | Source: Ambulatory Visit | Attending: Obstetrics and Gynecology | Admitting: Obstetrics and Gynecology

## 2016-11-17 ENCOUNTER — Encounter (HOSPITAL_COMMUNITY): Payer: Self-pay

## 2016-11-17 DIAGNOSIS — O26899 Other specified pregnancy related conditions, unspecified trimester: Secondary | ICD-10-CM | POA: Diagnosis not present

## 2016-11-17 DIAGNOSIS — R102 Pelvic and perineal pain: Secondary | ICD-10-CM

## 2016-11-17 DIAGNOSIS — R109 Unspecified abdominal pain: Secondary | ICD-10-CM | POA: Insufficient documentation

## 2016-11-17 DIAGNOSIS — O26892 Other specified pregnancy related conditions, second trimester: Secondary | ICD-10-CM | POA: Diagnosis present

## 2016-11-17 DIAGNOSIS — Z3A16 16 weeks gestation of pregnancy: Secondary | ICD-10-CM | POA: Insufficient documentation

## 2016-11-17 LAB — URINALYSIS, MICROSCOPIC (REFLEX): RBC / HPF: NONE SEEN RBC/hpf (ref 0–5)

## 2016-11-17 LAB — URINALYSIS, ROUTINE W REFLEX MICROSCOPIC
BILIRUBIN URINE: NEGATIVE
BILIRUBIN URINE: NEGATIVE
Glucose, UA: NEGATIVE mg/dL
Glucose, UA: NEGATIVE mg/dL
Hgb urine dipstick: NEGATIVE
Hgb urine dipstick: NEGATIVE
KETONES UR: NEGATIVE mg/dL
Ketones, ur: NEGATIVE mg/dL
NITRITE: NEGATIVE
Nitrite: NEGATIVE
PROTEIN: NEGATIVE mg/dL
Protein, ur: 30 mg/dL — AB
Specific Gravity, Urine: 1.018 (ref 1.005–1.030)
Specific Gravity, Urine: <1.005 — ABNORMAL LOW (ref 1.005–1.030)
pH: 8 (ref 5.0–8.0)
pH: 6 (ref 5.0–8.0)

## 2016-11-17 LAB — CBC WITH DIFFERENTIAL/PLATELET
BASOS ABS: 0 10*3/uL (ref 0.0–0.1)
BASOS PCT: 0 %
EOS ABS: 0.1 10*3/uL (ref 0.0–0.7)
Eosinophils Relative: 1 %
HCT: 30.3 % — ABNORMAL LOW (ref 36.0–46.0)
HEMOGLOBIN: 10.7 g/dL — AB (ref 12.0–15.0)
Lymphocytes Relative: 19 %
Lymphs Abs: 1.6 10*3/uL (ref 0.7–4.0)
MCH: 31.3 pg (ref 26.0–34.0)
MCHC: 35.3 g/dL (ref 30.0–36.0)
MCV: 88.6 fL (ref 78.0–100.0)
Monocytes Absolute: 0.2 10*3/uL (ref 0.1–1.0)
Monocytes Relative: 3 %
NEUTROS ABS: 6.5 10*3/uL (ref 1.7–7.7)
NEUTROS PCT: 77 %
Platelets: 249 10*3/uL (ref 150–400)
RBC: 3.42 MIL/uL — ABNORMAL LOW (ref 3.87–5.11)
RDW: 13.4 % (ref 11.5–15.5)
WBC: 8.3 10*3/uL (ref 4.0–10.5)

## 2016-11-17 LAB — WET PREP, GENITAL
Clue Cells Wet Prep HPF POC: NONE SEEN
SPERM: NONE SEEN
Trich, Wet Prep: NONE SEEN
YEAST WET PREP: NONE SEEN

## 2016-11-17 NOTE — Discharge Instructions (Signed)
Abdominal Pain During Pregnancy °Belly (abdominal) pain is common during pregnancy. Most of the time, it is not a serious problem. Other times, it can be a sign that something is wrong with the pregnancy. Always tell your doctor if you have belly pain. °Follow these instructions at home: °Monitor your belly pain for any changes. The following actions may help you feel better: °· Do not have sex (intercourse) or put anything in your vagina until you feel better. °· Rest until your pain stops. °· Drink clear fluids if you feel sick to your stomach (nauseous). Do not eat solid food until you feel better. °· Only take medicine as told by your doctor. °· Keep all doctor visits as told. °Get help right away if: °· You are bleeding, leaking fluid, or pieces of tissue come out of your vagina. °· You have more pain or cramping. °· You keep throwing up (vomiting). °· You have pain when you pee (urinate) or have blood in your pee. °· You have a fever. °· You do not feel your baby moving as much. °· You feel very weak or feel like passing out. °· You have trouble breathing, with or without belly pain. °· You have a very bad headache and belly pain. °· You have fluid leaking from your vagina and belly pain. °· You keep having watery poop (diarrhea). °· Your belly pain does not go away after resting, or the pain gets worse. °This information is not intended to replace advice given to you by your health care provider. Make sure you discuss any questions you have with your health care provider. °Document Released: 08/30/2009 Document Revised: 04/19/2016 Document Reviewed: 04/10/2013 °Elsevier Interactive Patient Education © 2017 [employer] ° °

## 2016-11-17 NOTE — MAU Provider Note (Signed)
History     CSN: [id number]  Arrival date and time: 11/17/16 1744   First Provider Initiated Contact with Patient 11/17/16 1840      Chief Complaint  Patient presents with  . Abdominal Pain   HPI  Misty Lewis is a 31 y.o. G2P0010 at 9230w2d who presents with abdominal pain. Pain started around 330 pm today. Initially was constant pain in her lower abdomen to bilateral upper thighs. Now pain is intermittent sharp pain that is worse in RLQ. Rates pain 6/10. Has not treated. Nothing makes better or worse. Denies dysuria, hematuria, urinary frequency, vaginal bleeding, vaginal discharge, n/v/d, constipation, recent intercourse, fever, or decreased appetite.  Last BM was this morning & normal for her.   OB History    Gravida Para Term Preterm AB Living   2 0     1     SAB TAB Ectopic Multiple Live Births   1              Past Medical History:  Diagnosis Date  . Eczema   . Migraine    mirana out 2015 so no longer have ha  . Seasonal allergies     History reviewed. No pertinent surgical history.  Family History  Problem Relation Age of Onset  . Diabetes Mother   . Hypertension Mother   . Hypertension Maternal Grandmother   . Lupus Maternal Aunt   . Lymphoma Maternal Aunt     Social History  Substance Use Topics  . Smoking status: Never Smoker  . Smokeless tobacco: Not on file  . Alcohol use No    Allergies:  Allergies  Allergen Reactions  . Coconut Flavor Anaphylaxis and Nausea And Vomiting  . Mushroom Extract Complex Anaphylaxis and Nausea And Vomiting  . Olive Oil Anaphylaxis and Nausea And Vomiting  . Cherry Nausea And Vomiting  . Mustard Seed Nausea And Vomiting    Prescriptions Prior to Admission  Medication Sig Dispense Refill Last Dose  . Prenatal Vit-Fe Fumarate-FA (PRENATAL MULTIVITAMIN) TABS tablet Take 1 tablet by mouth 3 (three) times daily.   01/13/2016 at Unknown time    Review of Systems  Constitutional: Negative.   Gastrointestinal:  Positive for abdominal pain. Negative for constipation, diarrhea, nausea and vomiting.  Genitourinary: Negative for dysuria, vaginal bleeding and vaginal discharge.   Physical Exam   Blood pressure 113/70, pulse 83, temperature 98.2 F (36.8 C), temperature source Oral, resp. rate 18, unknown if currently breastfeeding.  Physical Exam  Nursing note and vitals reviewed. Constitutional: She is oriented to person, place, and time. She appears well-developed and well-nourished. No distress.  HENT:  Head: Normocephalic and atraumatic.  Eyes: Conjunctivae are normal. Right eye exhibits no discharge. Left eye exhibits no discharge. No scleral icterus.  Neck: Normal range of motion.  Cardiovascular: Normal rate, regular rhythm and normal heart sounds.   No murmur heard. Respiratory: Effort normal and breath sounds normal. No respiratory distress. She has no wheezes.  GI: Soft. Bowel sounds are normal. There is tenderness in the right lower quadrant. There is no rigidity and no guarding.  Genitourinary:  Genitourinary Comments: SVE closed/?shortening  Neurological: She is alert and oriented to person, place, and time.  Skin: Skin is warm and dry. She is not diaphoretic.  Psychiatric: She has a normal mood and affect. Her behavior is normal. Judgment and thought content normal.   MAU Course  Procedures Results for orders placed or performed during the hospital encounter of 11/17/16 (from the past 24  hour(s))  Urinalysis, Routine w reflex microscopic     Status: Abnormal   Collection Time: 11/17/16  6:00 PM  Result Value Ref Range   Color, Urine YELLOW YELLOW   APPearance HAZY (A) CLEAR   Specific Gravity, Urine 1.018 1.005 - 1.030   pH 8.0 5.0 - 8.0   Glucose, UA NEGATIVE NEGATIVE mg/dL   Hgb urine dipstick NEGATIVE NEGATIVE   Bilirubin Urine NEGATIVE NEGATIVE   Ketones, ur NEGATIVE NEGATIVE mg/dL   Protein, ur 30 (A) NEGATIVE mg/dL   Nitrite NEGATIVE NEGATIVE   Leukocytes, UA MODERATE  (A) NEGATIVE   RBC / HPF 0-5 0 - 5 RBC/hpf   WBC, UA 6-30 0 - 5 WBC/hpf   Bacteria, UA RARE (A) NONE SEEN   Squamous Epithelial / LPF 6-30 (A) NONE SEEN   Mucous PRESENT   CBC with Differential     Status: Abnormal   Collection Time: 11/17/16  6:58 PM  Result Value Ref Range   WBC 8.3 4.0 - 10.5 K/uL   RBC 3.42 (L) 3.87 - 5.11 MIL/uL   Hemoglobin 10.7 (L) 12.0 - 15.0 g/dL   HCT 13.0 (L) 86.5 - 78.4 %   MCV 88.6 78.0 - 100.0 fL   MCH 31.3 26.0 - 34.0 pg   MCHC 35.3 30.0 - 36.0 g/dL   RDW 69.6 29.5 - 28.4 %   Platelets 249 150 - 400 K/uL   Neutrophils Relative % 77 %   Neutro Abs 6.5 1.7 - 7.7 K/uL   Lymphocytes Relative 19 %   Lymphs Abs 1.6 0.7 - 4.0 K/uL   Monocytes Relative 3 %   Monocytes Absolute 0.2 0.1 - 1.0 K/uL   Eosinophils Relative 1 %   Eosinophils Absolute 0.1 0.0 - 0.7 K/uL   Basophils Relative 0 %   Basophils Absolute 0.0 0.0 - 0.1 K/uL  Urinalysis, Routine w reflex microscopic     Status: Abnormal   Collection Time: 11/17/16  7:00 PM  Result Value Ref Range   Color, Urine YELLOW YELLOW   APPearance CLEAR CLEAR   Specific Gravity, Urine <1.005 (L) 1.005 - 1.030   pH 6.0 5.0 - 8.0   Glucose, UA NEGATIVE NEGATIVE mg/dL   Hgb urine dipstick NEGATIVE NEGATIVE   Bilirubin Urine NEGATIVE NEGATIVE   Ketones, ur NEGATIVE NEGATIVE mg/dL   Protein, ur NEGATIVE NEGATIVE mg/dL   Nitrite NEGATIVE NEGATIVE   Leukocytes, UA MODERATE (A) NEGATIVE  Urinalysis, Microscopic (reflex)     Status: Abnormal   Collection Time: 11/17/16  7:00 PM  Result Value Ref Range   RBC / HPF NONE SEEN 0 - 5 RBC/hpf   WBC, UA 6-30 0 - 5 WBC/hpf   Bacteria, UA FEW (A) NONE SEEN   Squamous Epithelial / LPF 6-30 (A) NONE SEEN    MDM FHT 136 by doppler CBC, u/a SVE closed/?shortening TVUS for cervical length -- CL 4.7 cm S/w Dr. Senaida Ores. Ok to discharge home after wet prep returns  Care turned over to [facility], CNM     Judeth Horn, NP 11/17/2016 8:12 PM   Wet prep  neg GC/CT pending No signs of threatened SAB. Stable for discharge home. Assessment and Plan  [redacted] weeks gestation Abdominal pain in pregnancy Round ligament pain  Discharge home Follow up in office as scheduled in 2 weeks Warm bath  Tylenol prn SAB precautions  Misty Lewis Larry, CNM  11/17/2016 9:48 PM

## 2016-11-17 NOTE — MAU Note (Signed)
Pain started in thighs at 1530, moved up to lower abd, feels almost like contractions, denies bleeding or leaking.

## 2016-11-17 NOTE — MAU Note (Addendum)
G2P0 @  16.[redacted] wksga. Presents to triage for abdominal pain since 3:30 from "thigh to moving up". States pain now throbbing only on right side intermittantly in abdomen. Denies lof or bleeding. + fluttering.   Doppler: 136  1900: Up to bathroom 1906: Urine sent to lab. Awaiting for U/S  1924: pt to U/S via wheel chair with RN Tech  2010: back from U/S  2030: GC and wetprep done per provider's orders and sent to lab  2140: Discharge instructions given with pt understanding.   2145: pt left unit via ambulatory with So.

## 2016-11-19 LAB — CULTURE, OB URINE

## 2016-11-20 LAB — GC/CHLAMYDIA PROBE AMP (~~LOC~~) NOT AT ARMC
Chlamydia: NEGATIVE
Neisseria Gonorrhea: NEGATIVE

## 2017-04-05 LAB — OB RESULTS CONSOLE GBS: GBS: NEGATIVE

## 2017-05-01 ENCOUNTER — Encounter (HOSPITAL_COMMUNITY): Payer: Self-pay | Admitting: *Deleted

## 2017-05-01 ENCOUNTER — Encounter (HOSPITAL_COMMUNITY): Payer: Self-pay

## 2017-05-01 ENCOUNTER — Inpatient Hospital Stay (HOSPITAL_COMMUNITY)
Admission: AD | Admit: 2017-05-01 | Discharge: 2017-05-03 | DRG: 775 | Disposition: A | Discharge status: Home / Self Care | Payer: Managed Care, Other (non HMO) | Source: Ambulatory Visit | Attending: Obstetrics and Gynecology | Admitting: Obstetrics and Gynecology

## 2017-05-01 ENCOUNTER — Telehealth (HOSPITAL_COMMUNITY): Payer: Self-pay | Admitting: *Deleted

## 2017-05-01 DIAGNOSIS — Z3A39 39 weeks gestation of pregnancy: Secondary | ICD-10-CM | POA: Diagnosis not present

## 2017-05-01 DIAGNOSIS — Z3493 Encounter for supervision of normal pregnancy, unspecified, third trimester: Secondary | ICD-10-CM | POA: Diagnosis present

## 2017-05-01 LAB — TYPE AND SCREEN
ABO/RH(D): A POS
ANTIBODY SCREEN: NEGATIVE

## 2017-05-01 LAB — CBC
HCT: 31.3 % — ABNORMAL LOW (ref 36.0–46.0)
Hemoglobin: 10.1 g/dL — ABNORMAL LOW (ref 12.0–15.0)
MCH: 27.6 pg (ref 26.0–34.0)
MCHC: 32.3 g/dL (ref 30.0–36.0)
MCV: 85.5 fL (ref 78.0–100.0)
PLATELETS: 244 10*3/uL (ref 150–400)
RBC: 3.66 MIL/uL — ABNORMAL LOW (ref 3.87–5.11)
RDW: 14.3 % (ref 11.5–15.5)
WBC: 12 10*3/uL — ABNORMAL HIGH (ref 4.0–10.5)

## 2017-05-01 MED ORDER — DIPHENHYDRAMINE HCL 50 MG/ML IJ SOLN: 12.5000 mg | INTRAMUSCULAR | Status: DC | PRN

## 2017-05-01 MED ORDER — ONDANSETRON HCL 4 MG/2ML IJ SOLN: 4.0000 mg | Freq: Four times a day (QID) | INTRAMUSCULAR | Status: DC | PRN

## 2017-05-01 MED ORDER — LACTATED RINGERS IV SOLN
INTRAVENOUS | Status: DC
  Administered 2017-05-01: 18:00:00 via INTRAVENOUS

## 2017-05-01 MED ORDER — SOD CITRATE-CITRIC ACID 500-334 MG/5ML PO SOLN: 30.0000 mL | ORAL | Status: DC | PRN

## 2017-05-01 MED ORDER — OXYTOCIN BOLUS FROM INFUSION
500.0000 mL | Freq: Once | INTRAVENOUS | Status: AC
  Administered 2017-05-01: 500 mL via INTRAVENOUS

## 2017-05-01 MED ORDER — FENTANYL 2.5 MCG/ML BUPIVACAINE 1/10 % EPIDURAL INFUSION (WH - ANES): 14.0000 mL/h | INTRAMUSCULAR | Status: DC | PRN

## 2017-05-01 MED ORDER — LACTATED RINGERS IV SOLN: 500.0000 mL | Freq: Once | INTRAVENOUS | Status: DC

## 2017-05-01 MED ORDER — EPHEDRINE 5 MG/ML INJ
10.0000 mg | INTRAVENOUS | Status: DC | PRN
  Filled 2017-05-01: qty 2

## 2017-05-01 MED ORDER — OXYCODONE-ACETAMINOPHEN 5-325 MG PO TABS: 2.0000 | ORAL_TABLET | ORAL | Status: DC | PRN

## 2017-05-01 MED ORDER — OXYTOCIN 40 UNITS IN LACTATED RINGERS INFUSION - SIMPLE MED
2.5000 [IU]/h | INTRAVENOUS | Status: DC
  Filled 2017-05-01: qty 1000

## 2017-05-01 MED ORDER — ACETAMINOPHEN 325 MG PO TABS: 650.0000 mg | ORAL_TABLET | ORAL | Status: DC | PRN

## 2017-05-01 MED ORDER — LIDOCAINE HCL (PF) 1 % IJ SOLN
30.0000 mL | INTRAMUSCULAR | Status: DC | PRN
  Administered 2017-05-01: 30 mL via SUBCUTANEOUS
  Filled 2017-05-01: qty 30

## 2017-05-01 MED ORDER — OXYTOCIN 10 UNIT/ML IJ SOLN
INTRAMUSCULAR | Status: AC
  Administered 2017-05-01: 10 [IU]
  Filled 2017-05-01: qty 1

## 2017-05-01 MED ORDER — LACTATED RINGERS IV SOLN: 500.0000 mL | INTRAVENOUS | Status: DC | PRN

## 2017-05-01 MED ORDER — PHENYLEPHRINE 40 MCG/ML (10ML) SYRINGE FOR IV PUSH (FOR BLOOD PRESSURE SUPPORT)
80.0000 ug | PREFILLED_SYRINGE | INTRAVENOUS | Status: DC | PRN
  Filled 2017-05-01: qty 5

## 2017-05-01 MED ORDER — IBUPROFEN 600 MG PO TABS
600.0000 mg | ORAL_TABLET | Freq: Four times a day (QID) | ORAL | Status: DC
  Administered 2017-05-01 – 2017-05-03 (×7): 600 mg via ORAL
  Filled 2017-05-01 (×7): qty 1

## 2017-05-01 MED ORDER — OXYCODONE-ACETAMINOPHEN 5-325 MG PO TABS: 1.0000 | ORAL_TABLET | ORAL | Status: DC | PRN

## 2017-05-01 MED ORDER — BUTORPHANOL TARTRATE 1 MG/ML IJ SOLN: 1.0000 mg | INTRAMUSCULAR | Status: DC | PRN

## 2017-05-01 NOTE — MAU Note (Signed)
Reports ctx every 2-3 min, 4-5cm in the office today. No bleeding, no LOF.

## 2017-05-01 NOTE — Telephone Encounter (Signed)
Preadmission screen  

## 2017-05-01 NOTE — H&P (Signed)
Misty EasterlyStella D Lewis is a 31 y.o. female, G2 P0010, EGA 39+ weeks with EDC 8-9  presenting for ctx.  On eval in MAU VE 7 cm dilated with regular ctx.  Pt admitted, using NO2 for pain, has progressed to 8-9 cm.  Prenatal care complicated only by 4 cm myoma.  OB History    Gravida Para Term Preterm AB Living   2 0     1     SAB TAB Ectopic Multiple Live Births   1             Past Medical History:  Diagnosis Date  . Eczema   . Migraine    mirena out 2015 so no longer have ha  . Seasonal allergies    History reviewed. No pertinent surgical history. Family History: family history includes Diabetes in her mother; Hypertension in her maternal grandmother and mother; Lupus in her maternal aunt; Lymphoma in her maternal aunt. Social History:  reports that she has never smoked. She has never used smokeless tobacco. She reports that she does not drink alcohol or use drugs.     Maternal Diabetes: No Genetic Screening: Normal Maternal Ultrasounds/Referrals: Normal Fetal Ultrasounds or other Referrals:  None Maternal Substance Abuse:  No Significant Maternal Medications:  None Significant Maternal Lab Results:  Lab values include: Group B Strep negative Other Comments:  None  Review of Systems  Respiratory: Negative.   Cardiovascular: Negative.    Maternal Medical History:  Reason for admission: Contractions.   Contractions: Frequency: regular.   Perceived severity is strong.    Fetal activity: Perceived fetal activity is normal.    Prenatal complications: no prenatal complications  AROM clear Dilation: 8.5 Effacement (%): 90 Station: 0 Exam by:: Dr. Jackelyn Lewis Blood pressure 122/78, pulse 92, temperature 98.2 F (36.8 C), temperature source Oral, resp. rate 18, height 5\' 5"  (1.651 m), weight 89.8 kg (198 lb), unknown if currently breastfeeding. Maternal Exam:  Uterine Assessment: Contraction strength is firm.  Contraction frequency is regular.   Abdomen: Patient reports no  abdominal tenderness. Estimated fetal weight is 8 lbs.   Fetal presentation: vertex  Introitus: Normal vulva. Normal vagina.  Amniotic fluid character: clear.  Pelvis: adequate for delivery.   Cervix: Cervix evaluated by digital exam.     Fetal Exam Fetal Monitor Review: Mode: ultrasound.   Baseline rate: 120-130.  Variability: moderate (6-25 bpm).   Pattern: accelerations present and no decelerations.    Fetal State Assessment: Category I - tracings are normal.     Physical Exam  Vitals reviewed. Constitutional: She appears well-developed and well-nourished.  Cardiovascular: Normal rate and regular rhythm.   Respiratory: Effort normal. No respiratory distress.  GI: Soft.    Prenatal labs: ABO, Rh: --/--/A POS (08/07 1831) Antibody: NEG (08/07 1831) Rubella: Immune (01/11 0000) RPR: Nonreactive (01/11 0000)  HBsAg: Negative (01/11 0000)  HIV: Non-reactive (01/11 0000)  GBS: Negative (07/12 0000)   Assessment/Plan: IUP at 39+ weeks in active labor.  AROM done for augmentation, anticipate SVD.   Misty Lewis 05/01/2017, 10:25 PM

## 2017-05-02 ENCOUNTER — Encounter (HOSPITAL_COMMUNITY): Payer: Self-pay

## 2017-05-02 LAB — CBC
HEMATOCRIT: 25.9 % — AB (ref 36.0–46.0)
HEMOGLOBIN: 8.4 g/dL — AB (ref 12.0–15.0)
MCH: 27.5 pg (ref 26.0–34.0)
MCHC: 32.4 g/dL (ref 30.0–36.0)
MCV: 84.9 fL (ref 78.0–100.0)
Platelets: 241 10*3/uL (ref 150–400)
RBC: 3.05 MIL/uL — AB (ref 3.87–5.11)
RDW: 14.5 % (ref 11.5–15.5)
WBC: 18.5 10*3/uL — ABNORMAL HIGH (ref 4.0–10.5)

## 2017-05-02 LAB — RPR: RPR Ser Ql: NONREACTIVE

## 2017-05-02 MED ORDER — ACETAMINOPHEN 325 MG PO TABS
650.0000 mg | ORAL_TABLET | ORAL | Status: DC | PRN
  Administered 2017-05-02 (×2): 650 mg via ORAL
  Filled 2017-05-02 (×2): qty 2

## 2017-05-02 MED ORDER — METHYLERGONOVINE MALEATE 0.2 MG PO TABS: 0.2000 mg | ORAL_TABLET | ORAL | Status: DC | PRN

## 2017-05-02 MED ORDER — TETANUS-DIPHTH-ACELL PERTUSSIS 5-2.5-18.5 LF-MCG/0.5 IM SUSP: 0.5000 mL | Freq: Once | INTRAMUSCULAR | Status: DC

## 2017-05-02 MED ORDER — MAGNESIUM HYDROXIDE 400 MG/5ML PO SUSP: 30.0000 mL | ORAL | Status: DC | PRN

## 2017-05-02 MED ORDER — SIMETHICONE 80 MG PO CHEW: 80.0000 mg | CHEWABLE_TABLET | ORAL | Status: DC | PRN

## 2017-05-02 MED ORDER — METHYLERGONOVINE MALEATE 0.2 MG/ML IJ SOLN: 0.2000 mg | INTRAMUSCULAR | Status: DC | PRN

## 2017-05-02 MED ORDER — ONDANSETRON HCL 4 MG PO TABS: 4.0000 mg | ORAL_TABLET | ORAL | Status: DC | PRN

## 2017-05-02 MED ORDER — DIPHENHYDRAMINE HCL 25 MG PO CAPS: 25.0000 mg | ORAL_CAPSULE | Freq: Four times a day (QID) | ORAL | Status: DC | PRN

## 2017-05-02 MED ORDER — PRENATAL MULTIVITAMIN CH
1.0000 | ORAL_TABLET | Freq: Every day | ORAL | Status: DC
  Administered 2017-05-02 – 2017-05-03 (×2): 1 via ORAL
  Filled 2017-05-02 (×2): qty 1

## 2017-05-02 MED ORDER — COCONUT OIL OIL: 1.0000 "application " | TOPICAL_OIL | Status: DC | PRN

## 2017-05-02 MED ORDER — ONDANSETRON HCL 4 MG/2ML IJ SOLN: 4.0000 mg | INTRAMUSCULAR | Status: DC | PRN

## 2017-05-02 MED ORDER — FERROUS SULFATE 325 (65 FE) MG PO TABS
325.0000 mg | ORAL_TABLET | Freq: Two times a day (BID) | ORAL | Status: DC
  Administered 2017-05-02: 325 mg via ORAL
  Filled 2017-05-02: qty 1

## 2017-05-02 MED ORDER — WITCH HAZEL-GLYCERIN EX PADS: 1.0000 "application " | MEDICATED_PAD | CUTANEOUS | Status: DC | PRN

## 2017-05-02 MED ORDER — OXYCODONE HCL 5 MG PO TABS: 5.0000 mg | ORAL_TABLET | ORAL | Status: DC | PRN

## 2017-05-02 MED ORDER — MEASLES, MUMPS & RUBELLA VAC ~~LOC~~ INJ
0.5000 mL | INJECTION | Freq: Once | SUBCUTANEOUS | Status: DC
  Filled 2017-05-02: qty 0.5

## 2017-05-02 MED ORDER — SENNOSIDES-DOCUSATE SODIUM 8.6-50 MG PO TABS
2.0000 | ORAL_TABLET | ORAL | Status: DC
  Administered 2017-05-03: 2 via ORAL
  Filled 2017-05-02 (×2): qty 2

## 2017-05-02 MED ORDER — BENZOCAINE-MENTHOL 20-0.5 % EX AERO: 1.0000 "application " | INHALATION_SPRAY | CUTANEOUS | Status: DC | PRN

## 2017-05-02 MED ORDER — DIBUCAINE 1 % RE OINT: 1.0000 "application " | TOPICAL_OINTMENT | RECTAL | Status: DC | PRN

## 2017-05-02 MED ORDER — ZOLPIDEM TARTRATE 5 MG PO TABS: 5.0000 mg | ORAL_TABLET | Freq: Every evening | ORAL | Status: DC | PRN

## 2017-05-02 MED ORDER — OXYCODONE HCL 5 MG PO TABS: 10.0000 mg | ORAL_TABLET | ORAL | Status: DC | PRN

## 2017-05-02 NOTE — Progress Notes (Addendum)
Patient ID: Misty Lewis, female   DOB: 10-03-1985, 31 y.o.   MRN: [id number] Pt doing well. Lochia mild, pain controlled. Denies CP/HA/lighheadedness. Voiding well. Breastfeeding and bonding well with baby. No complaints VSS ABD- FF and below umbilicus EXT - no homans  Cbc pending  A/P: PPD#1 s/p svd - stable         Start on Fe supp BID         Routine pp care         Likely discharge to home tomorrow

## 2017-05-02 NOTE — Lactation Note (Signed)
This note was copied from a baby's chart. Lactation Consultation Note  Patient Name: Misty Leverne HumblesStella Mcclain ZOXWR'UToday's Date: 05/02/2017 Reason for consult: Initial assessment   Initial assessment with first time mom of term infant at 7812 hours of age. Mom reports BF is going well. Infant with 3 BF for 10-25 minutes, 2 attempts, and 2 stools since birth. LATCH score 9. Infant weight 7 lb 4.6 oz. Infant currently asleep in crib.   Enc mom to feed infant STS 8-12 x in 24 hours at first feeding cues for as long as infant wishes, offering both breasts with each feeding. Mom reports she has been shown to hand express, enc mom to hand express before each latch and after BF to apply EBM to nipples. Enc mom to use pillow and head support with all feedings. Reviewed normalcy of cluster feeding with mom. Enc mom to maintain feeding log until infant gaining weight.   BF resources Handout and LC Brochure given, mom informed of IP/OP Services, BF Support Groups and LC phone #. Mom reports she has a Medela PIS at home for use. Mom denies questions/concerns at this time. Enc mom to call out for feeding assistance as needed.    Maternal Data Formula Feeding for Exclusion: No Has patient been taught Hand Expression?: Yes Does the patient have breastfeeding experience prior to this delivery?: No  Feeding Feeding Type: Breast Fed Length of feed: 8 min  LATCH Score Latch: Grasps breast easily, tongue down, lips flanged, rhythmical sucking.  Audible Swallowing: Spontaneous and intermittent  Type of Nipple: Everted at rest and after stimulation  Comfort (Breast/Nipple): Soft / non-tender  Hold (Positioning): Assistance needed to correctly position infant at breast and maintain latch.  LATCH Score: 9  Interventions Interventions: Breast feeding basics reviewed;Support pillows;Skin to skin;Expressed milk;Hand express  Lactation Tools Discussed/Used WIC Program: No   Consult Status Consult Status:  Follow-up Date: 05/03/17 Follow-up type: In-patient    Silas FloodSharon S Hice 05/02/2017, 11:59 AM

## 2017-05-03 MED ORDER — IBUPROFEN 600 MG PO TABS: 600.0000 mg | ORAL_TABLET | Freq: Four times a day (QID) | ORAL | 0 refills | Status: DC

## 2017-05-03 NOTE — Progress Notes (Signed)
Post Partum Day 2 Subjective: no complaints, up ad lib and tolerating PO  Objective: Blood pressure 117/70, pulse 87, temperature 98.4 F (36.9 C), resp. rate 18, height 5\' 5"  (1.651 m), weight 89.8 kg (198 lb), unknown if currently breastfeeding.  Physical Exam:  General: alert and cooperative Lochia: appropriate Uterine Fundus: firm   Recent Labs  05/01/17 1831 05/02/17 0955  HGB 10.1* 8.4*  HCT 31.3* 25.9*    Assessment/Plan: Discharge home   LOS: 2 days   Oliver PilaKathy W Ferlando Lia 05/03/2017, 9:34 AM

## 2017-05-03 NOTE — Lactation Note (Signed)
This note was copied from a baby's chart. Lactation Consultation Note  Patient Name: Misty Lewis Reason for consult: Follow-up assessment Baby at 35 hr of life and dyad set for d/c today. Mom is reporting a sore R nipple. No skin break down or bruising noted on the nipple surface. She has thin line of bright red flesh around the base of the nipple shaft. She is currently wearing comfort gels. Mom stated she has day an increase in manually expressed volume over the last couple of hours. She and Dad feel comfortable with spoon feeding. Demonstrated cup feeding. Mom feels comfortable with her Medela PIS at home. Parents stated they feel "good" about bf. Mom will offer the breast on demand 8-12x/24hr, post express, and offer expressed milk per volume guidelines as needed. Parents are aware of lactation services and support group. They will call as needed.    Maternal Data    Feeding    LATCH Score                   Interventions    Lactation Tools Discussed/Used     Consult Status Consult Status: Complete Follow-up type: Call as needed    Misty Lewis Lewis, 10:42 AM

## 2017-05-03 NOTE — Discharge Summary (Signed)
OB Discharge Summary     Patient Name: Misty Lewis DOB: [birth year] MRN: [id number]  Date of admission: 05/01/2017 Delivering MD: Jackelyn Knife, TODD   Date of discharge: 05/03/2017  Admitting diagnosis: 40wks CTX 2 to 3 mins Intrauterine pregnancy: [redacted]w[redacted]d     Secondary diagnosis:  Active Problems:   Indication for care in labor or delivery   SVD (spontaneous vaginal delivery)  Additional problems: none     Discharge diagnosis: Term Pregnancy Delivered                                                                                                Post partum procedures: none  Augmentation: AROM  Complications: None  Hospital course:  Onset of Labor With Vaginal Delivery     31 y.o. yo G2P1011 at [redacted]w[redacted]d was admitted in Active Labor on 05/01/2017. Patient had an uncomplicated labor course as follows:  Membrane Rupture Time/Date: 10:21 PM ,05/01/2017   Intrapartum Procedures: Episiotomy: None [1]                                         Lacerations:  2nd degree [3];Labial [10]  Patient had a delivery of a Viable infant. 05/01/2017  Information for the patient's newborn:  Avri, Paiva [[id number]]  Delivery Method: Vaginal, Spontaneous Delivery (Filed from Delivery Summary)    Pateint had an uncomplicated postpartum course.  She is ambulating, tolerating a regular diet, passing flatus, and urinating well. Patient is discharged home in stable condition on 05/03/17.   Physical exam  Vitals:   05/02/17 0200 05/02/17 0600 05/02/17 1811 05/03/17 0535  BP: 118/66 103/64 110/62 117/70  Pulse: 80 71 85 87  Resp: 18 18 17 18   Temp: 98.1 F (36.7 C) 98.1 F (36.7 C)  98.4 F (36.9 C)  TempSrc: Oral     Weight:      Height:       General: alert and cooperative Lochia: appropriate Uterine Fundus: firm  Labs: Lab Results  Component Value Date   WBC 18.5 (H) 05/02/2017   HGB 8.4 (L) 05/02/2017   HCT 25.9 (L) 05/02/2017   MCV 84.9 05/02/2017   PLT 241 05/02/2017   CMP  Latest Ref Rng & Units 02/01/2015  Glucose 70 - 99 mg/dL 88  BUN 6 - 20 mg/dL 8  Creatinine 7.82 - 9.56 mg/dL 2.13  Sodium 086 - 578 mmol/L 137  Potassium 3.5 - 5.1 mmol/L 3.4(L)  Chloride 101 - 111 mmol/L 103  CO2 22 - 32 mmol/L 26  Calcium 8.9 - 10.3 mg/dL 9.5    Discharge instruction: per After Visit Summary and "Baby and Me Booklet".  After visit meds:  Allergies as of 05/03/2017      Reactions   Coconut Flavor Anaphylaxis, Nausea And Vomiting   Mushroom Extract Complex Anaphylaxis, Nausea And Vomiting   mushrooms   Cherry Nausea And Vomiting   Mustard Seed Nausea And Vomiting   Condiment- Mustard    Other Hives   olives  Medication List    TAKE these medications   ibuprofen 600 MG tablet Commonly known as:  ADVIL,MOTRIN Take 1 tablet (600 mg total) by mouth every 6 (six) hours.   prenatal multivitamin Tabs tablet Take 1 tablet by mouth daily at 12 noon.       Diet: routine diet  Activity: Advance as tolerated. Pelvic rest for 6 weeks.   Outpatient follow up:6 weeks Follow up Appt:No future appointments. Follow up Visit:No Follow-up on file.  Postpartum contraception: Undecided  Newborn Data: Live born female  Birth Weight: 7 lb 4.6 oz (3306 g) APGAR: 8, 9  Baby Feeding: Breast Disposition:home with mother   05/03/2017 Oliver PilaKathy W Iran Kievit, MD

## 2017-05-08 ENCOUNTER — Inpatient Hospital Stay (HOSPITAL_COMMUNITY): Admission: RE | Admit: 2017-05-08 | Payer: Managed Care, Other (non HMO) | Source: Ambulatory Visit

## 2017-09-22 IMAGING — US US MFM OB TRANSVAGINAL
1 series · 15 of 21 positions shown · non-contrast
Comparison: none

[Series 1: us mfm ob transvaginal · 21 acquisitions, 15 frames shown]
[im 1/21]
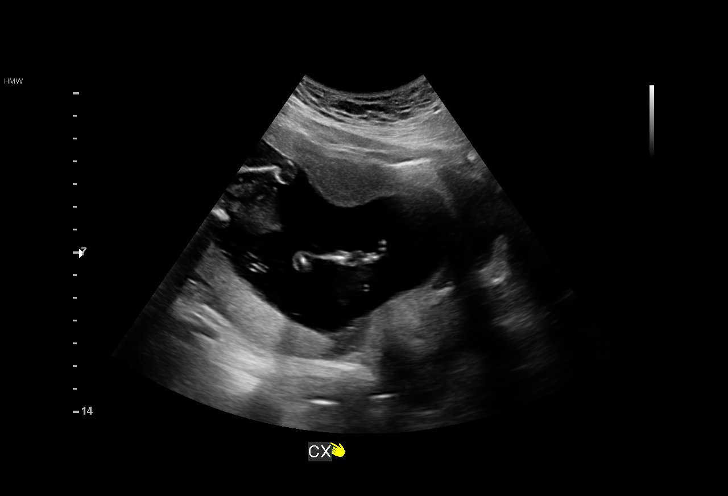
[im 3/21]
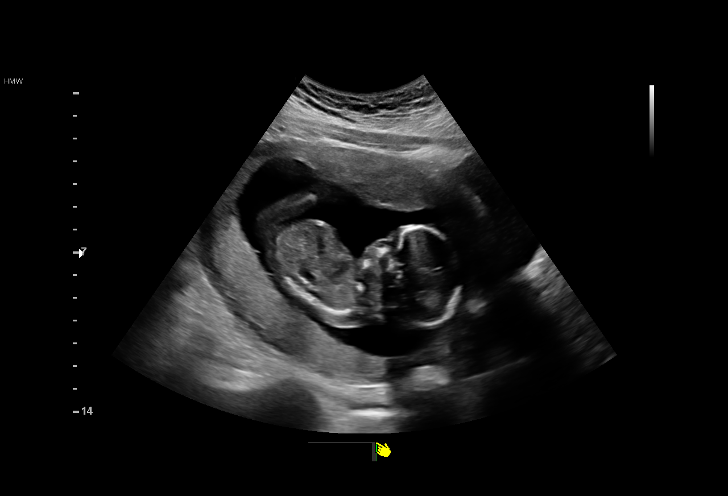
[im 4/21]
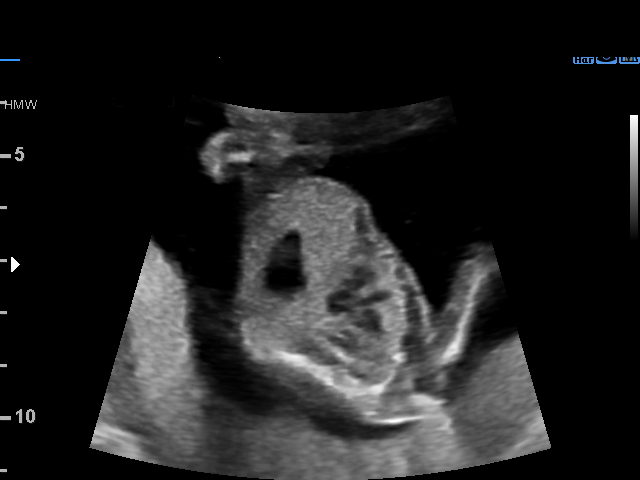
[im 5/21]
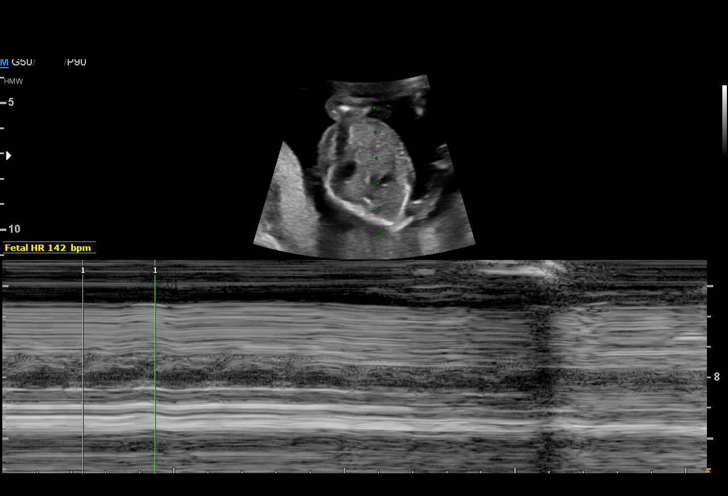
[im 7/21]
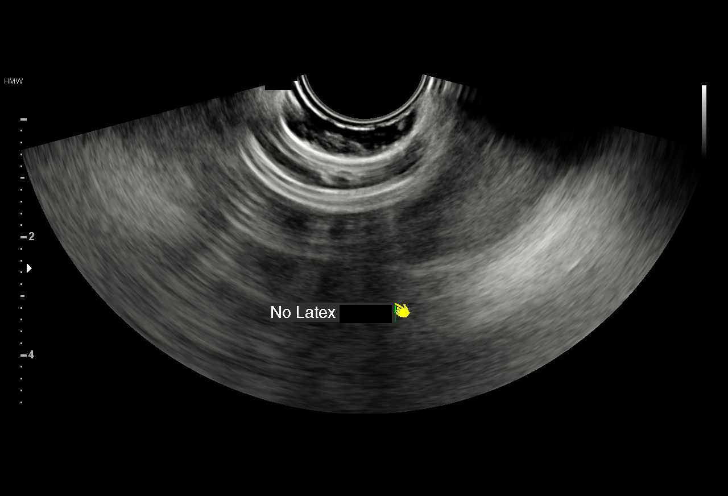
[im 8/21]
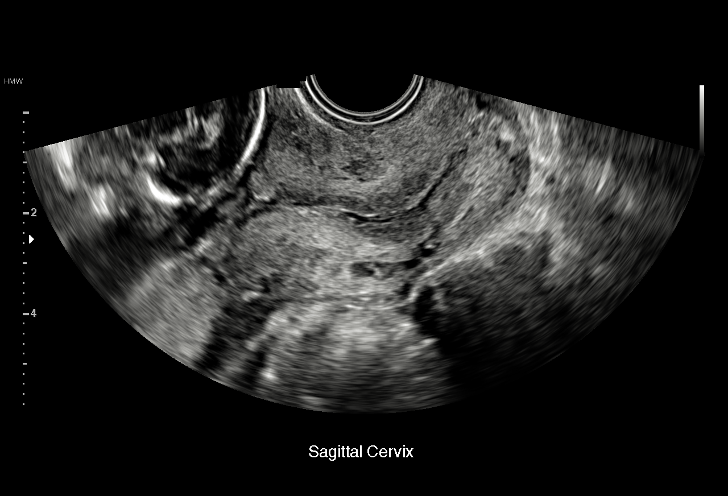
[im 10/21]
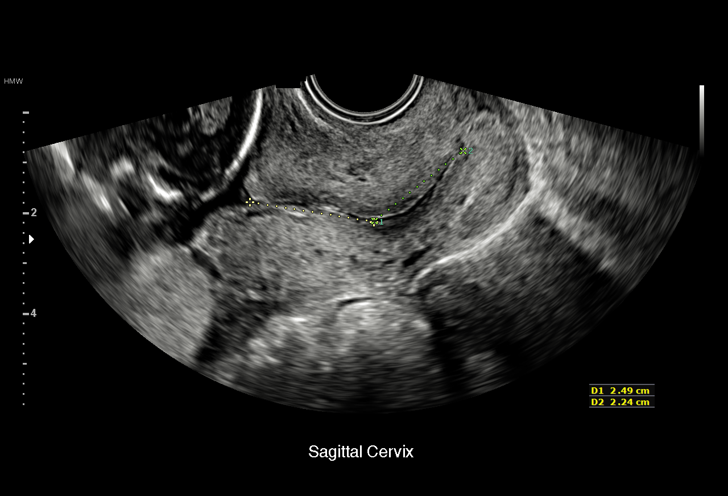
[im 11/21]
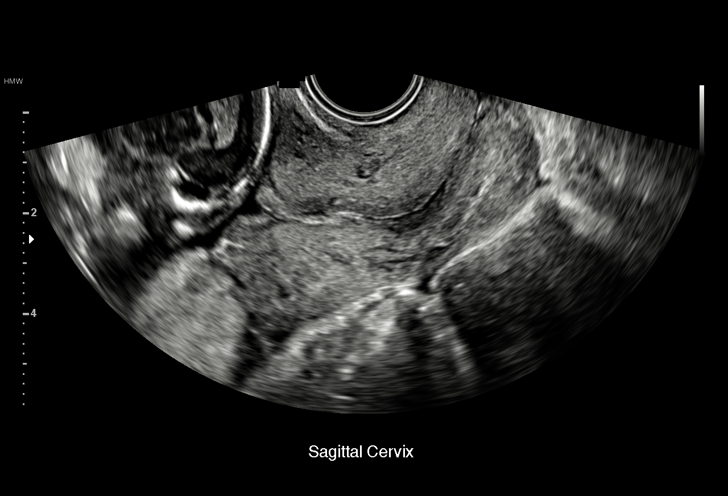
[im 12/21]
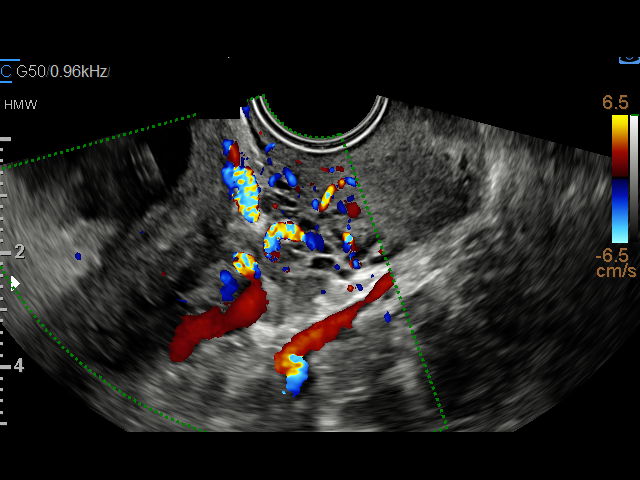
[im 14/21]
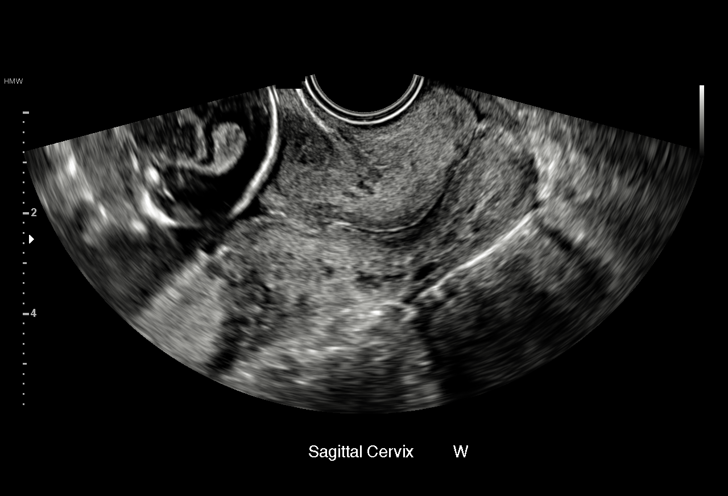
[im 15/21]
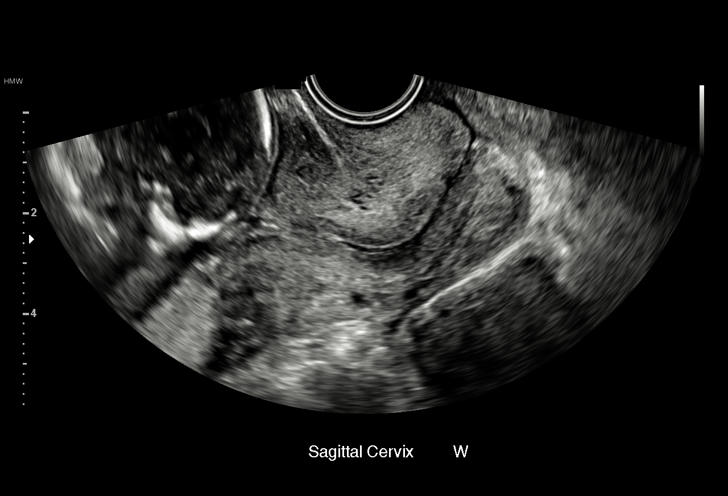
[im 17/21]
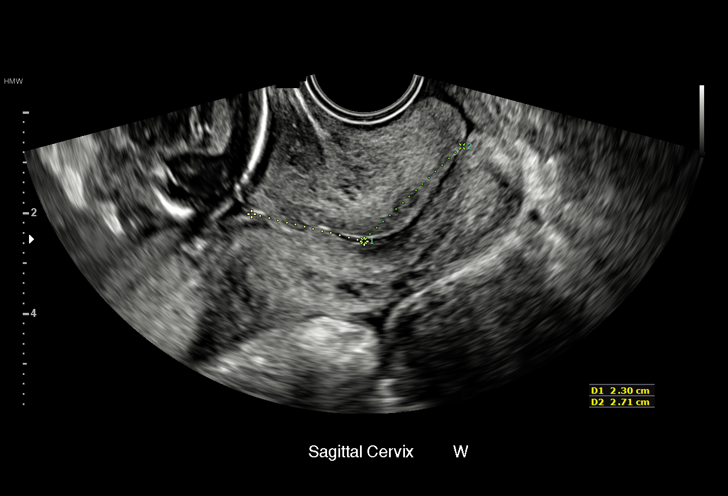
[im 18/21]
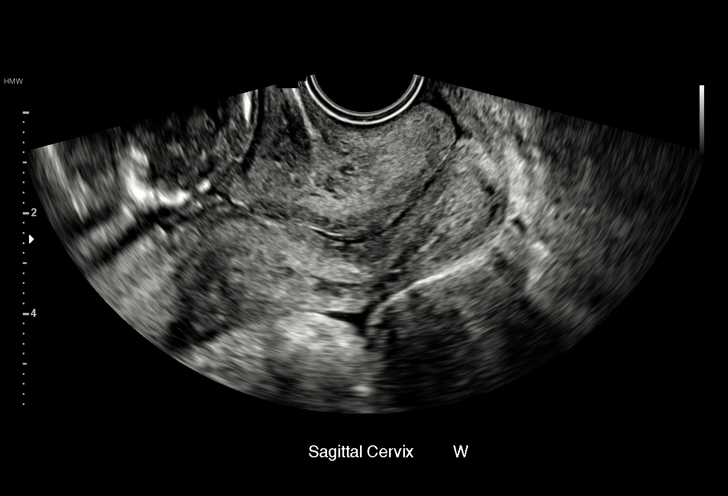
[im 19/21]
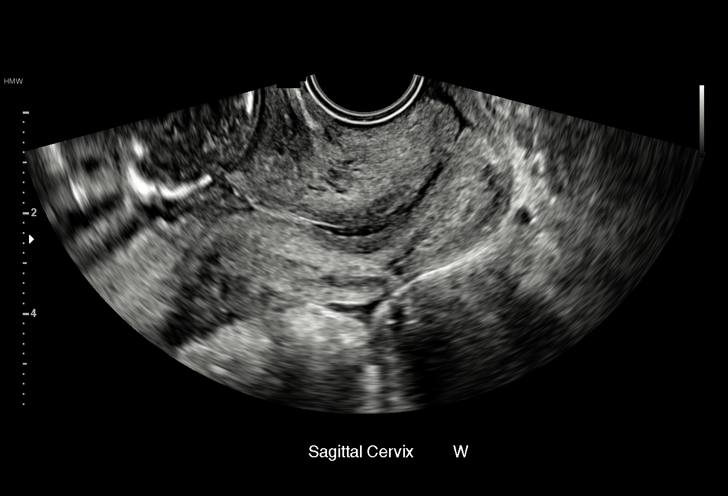
[im 21/21]
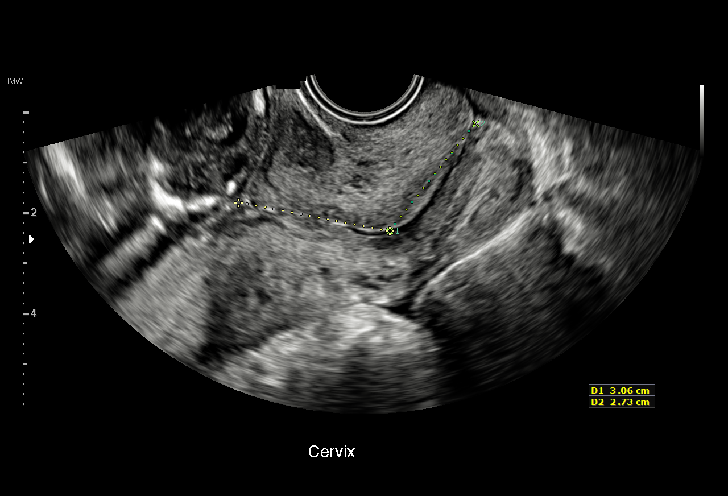

[15 of 21 positions shown; findings below may reference images not displayed]

Indications

16 weeks gestation of pregnancy
Abdominal pain in pregnancy
Encounter for cervical length
OB History

Gravidity:    2          SAB:   1
Living:       0
Fetal Evaluation

Num Of Fetuses:     1
Fetal Heart         142
Rate(bpm):
Cardiac Activity:   Observed
Presentation:       Cephalic
Gestational Age

Clinical EDD:  16w 2d                                        EDD:   05/02/17
Best:          16w 2d     Det. By:  Clinical EDD             EDD:   05/02/17
Cervix Uterus Adnexa

Cervix
Length:            4.7  cm.
Measured transvaginally.
Impression

Singleton intrauterine pregnancy at 16 weeks 2 days
gestation with fetal cardiac activity
Cephalic presentation
Cervical length 
4.7 cm without funneling
No sonographic evidence of intrauteirne pregnancy
Recommendations

Follow-up ultrasounds as clinically indicated.

## 2018-10-07 ENCOUNTER — Ambulatory Visit (INDEPENDENT_AMBULATORY_CARE_PROVIDER_SITE_OTHER): Payer: 59 | Admitting: Nurse Practitioner

## 2018-10-07 ENCOUNTER — Encounter: Payer: Self-pay | Admitting: Nurse Practitioner

## 2018-10-07 VITALS — BP 114/80 | HR 75 | Temp 98.0°F | Ht 64.0 in | Wt 213.0 lb

## 2018-10-07 DIAGNOSIS — E559 Vitamin D deficiency, unspecified: Secondary | ICD-10-CM | POA: Diagnosis not present

## 2018-10-07 NOTE — Progress Notes (Addendum)
Subjective:     Patient ID: Misty Lewis , female    DOB: 01/31/1986 , 33 y.o.   MRN: [id number]   Chief Complaint  Patient presents with  . Establish Care    patient states she is here to establish pcp.     HPI  Here to establish care - she had been going to [place] [facility] [place] the provider left.  Married, 1 child (17 month dgt). 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  GYN - [facility] - last PAP Sept 2018. She is currently lactating.   Non-smoker, social drinker (wine)  PMH - none   FMH - mother - diabetes.  Father - unknown.  Maternal grandfather - diabetes and hypertension. Maternal great aunt with breast cancer.    Vitamin d deficiency - patient reports she has had a low vitamin d in the past and has taken the high dose supplement in the past yet has been some time. She spends time in the sun as well     Past Medical History:  Diagnosis Date  . Eczema   . Migraine    mirena out 2015 so no longer have ha  . Seasonal allergies      Family History  Problem Relation Age of Onset  . Diabetes Mother   . Hypertension Mother   . Hypertension Maternal Grandmother   . Lupus Maternal Aunt   . Lymphoma Maternal Aunt      Current Outpatient Medications:  .  Prenatal Vit-Fe Fumarate-FA (PRENATAL MULTIVITAMIN) TABS tablet, Take 1 tablet by mouth daily at 12 noon. , Disp: , Rfl:    Allergies  Allergen Reactions  . Coconut Flavor Anaphylaxis and Nausea And Vomiting  . Mushroom Extract Complex Anaphylaxis and Nausea And Vomiting    mushrooms  . Cherry Nausea And Vomiting  . Mustard Seed Nausea And Vomiting    Condiment- Mustard   . Other Hives    olives     Review of Systems  Constitutional: Negative.   Respiratory: Negative.   Cardiovascular: Negative.   Musculoskeletal: Negative.   Neurological: Negative.      Today's Vitals   10/07/18 1106  BP: 114/80  Pulse: 75  Temp: 98 F (36.7 C)  TempSrc: Oral  SpO2: 97%  Weight: 213 lb (96.6 kg)  Height: 5\' 4"  (1.626 m)  PainSc: 0-No pain   Body mass index is 36.56 kg/m.   Objective:  Physical Exam Constitutional:      Appearance: Normal appearance.  Cardiovascular:     Rate and Rhythm: Normal rate and regular rhythm.     Pulses: Normal pulses.     Heart sounds: Normal heart sounds. No murmur.  Pulmonary:     Effort: Pulmonary effort is normal.     Breath sounds: Normal breath sounds.  Skin:    General: Skin is warm and dry.     Capillary Refill: Capillary refill takes less than 2 seconds.  Neurological:     Mental Status: She is alert.  Psychiatric:        Mood and Affect: Mood normal.         Assessment And Plan:     1. Vitamin D deficiency  Will check vitamin D level and supplement as needed.     Also encouraged to spend 15 minutes in the sun daily.  - Vitamin D (25 hydroxy) -  CMP14 + Anion Gap       Misty Felts, FNP

## 2018-10-07 NOTE — Patient Instructions (Signed)
Vitamin D Deficiency  Vitamin D deficiency is when your body does not have enough vitamin D. Vitamin D is important because:   It helps your body use other minerals that your body needs.   It helps keep your bones strong and healthy.   It may help to prevent some diseases.   It helps your heart and other muscles work well.  You can get vitamin D by:   Eating foods with vitamin D in them.   Drinking or eating milk or other foods that have had vitamin D added to them.   Taking a vitamin D supplement.   Being in the sun.  Not getting enough vitamin D can make your bones become soft. It can also cause other health problems.  Follow these instructions at home:   Take medicines and supplements only as told by your doctor.   Eat foods that have vitamin D. These include:  ? Dairy products, cereals, or juices with added vitamin D. Check the label for vitamin D.  ? Fatty fish like salmon or trout.  ? Eggs.  ? Oysters.   Do not use tanning beds.   Stay at a healthy weight. Lose weight, if needed.   Keep all follow-up visits as told by your doctor. This is important.  Contact a doctor if:   Your symptoms do not go away.   You feel sick to your stomach (nauseous).   Youthrow up (vomit).   You poop less often than usual or you have trouble pooping (constipation).  This information is not intended to replace advice given to you by your health care provider. Make sure you discuss any questions you have with your health care provider.  Document Released: 08/31/2011 Document Revised: 02/17/2016 Document Reviewed: 01/27/2015  Elsevier Interactive Patient Education  2019 Elsevier Inc[employer]

## 2018-10-08 LAB — VITAMIN D 25 HYDROXY (VIT D DEFICIENCY, FRACTURES): VIT D 25 HYDROXY: 18.7 ng/mL — AB (ref 30.0–100.0)

## 2018-10-08 LAB — CMP14 + ANION GAP
ALBUMIN: 4.6 g/dL (ref 3.5–5.5)
ALK PHOS: 137 IU/L — AB (ref 39–117)
ALT: 8 IU/L (ref 0–32)
AST: 17 IU/L (ref 0–40)
Albumin/Globulin Ratio: 1.5 (ref 1.2–2.2)
Anion Gap: 14 mmol/L (ref 10.0–18.0)
BUN / CREAT RATIO: 13 (ref 9–23)
BUN: 9 mg/dL (ref 6–20)
CHLORIDE: 100 mmol/L (ref 96–106)
CO2: 24 mmol/L (ref 20–29)
Calcium: 10 mg/dL (ref 8.7–10.2)
Creatinine, Ser: 0.68 mg/dL (ref 0.57–1.00)
GFR calc Af Amer: 136 mL/min/{1.73_m2} (ref >59)
GFR calc non Af Amer: 118 mL/min/{1.73_m2} (ref >59)
GLUCOSE: 84 mg/dL (ref 65–99)
Globulin, Total: 3 g/dL (ref 1.5–4.5)
Potassium: 4.4 mmol/L (ref 3.5–5.2)
Sodium: 138 mmol/L (ref 134–144)
Total Protein: 7.6 g/dL (ref 6.0–8.5)

## 2018-10-22 ENCOUNTER — Encounter: Payer: Self-pay | Admitting: Nurse Practitioner

## 2018-12-06 ENCOUNTER — Other Ambulatory Visit: Payer: Self-pay

## 2018-12-06 ENCOUNTER — Ambulatory Visit (INDEPENDENT_AMBULATORY_CARE_PROVIDER_SITE_OTHER): Payer: 59 | Admitting: Nurse Practitioner

## 2018-12-06 VITALS — BP 120/70 | HR 80 | Temp 98.3°F | Ht 64.0 in | Wt 216.4 lb

## 2018-12-06 DIAGNOSIS — R0789 Other chest pain: Secondary | ICD-10-CM

## 2018-12-06 DIAGNOSIS — G4459 Other complicated headache syndrome: Secondary | ICD-10-CM | POA: Diagnosis not present

## 2018-12-06 NOTE — Progress Notes (Signed)
Subjective:     Patient ID: Misty Lewis , female    DOB: July 22, 1986 , 33 y.o.   MRN: [id number]   Chief Complaint  Patient presents with  . Headache    started over last 3 months , some icthy throat   . Chest Pain    tightness in chest, happen in the last 3 months     HPI  She had pneumonia last January 2019.    She had been having issues with sleeping over the last few days.    She is breastfeeding.  She is unsure if she is anxious.    Headache   This is a new problem. The current episode started in the past 7 days. The problem occurs intermittently. The problem has been gradually improving. The pain does not radiate. The pain quality is not similar to prior headaches. Pertinent negatives include no abdominal pain, coughing, dizziness or nausea. Nothing aggravates the symptoms. She has tried acetaminophen and NSAIDs for the symptoms. There is no history of cancer.  Chest Pain   This is a new problem. The current episode started 1 to 4 weeks ago. The onset quality is sudden (upon awakening one morning, sharp pain ). The quality of the pain is described as sharp and tightness (lasted about 30 minutes). Associated symptoms include headaches (since last week ). Pertinent negatives include no abdominal pain, cough, dizziness, nausea or shortness of breath.  Pertinent negatives for past medical history include no cancer.     Past Medical History:  Diagnosis Date  . Eczema   . Migraine    mirena out 2015 so no longer have ha  . Seasonal allergies      Family History  Problem Relation Age of Onset  . Diabetes Mother   . Hypertension Mother   . Hypertension Maternal Grandmother   . Lupus Maternal Aunt   . Lymphoma Maternal Aunt      Current Outpatient Medications:  .  Prenatal Vit-Fe Fumarate-FA (PRENATAL MULTIVITAMIN) TABS tablet, Take 1 tablet by mouth daily at 12 noon. , Disp: , Rfl:    Allergies  Allergen Reactions  . Coconut Flavor Anaphylaxis and Nausea And  Vomiting  . Mushroom Extract Complex Anaphylaxis and Nausea And Vomiting    mushrooms  . Cherry Nausea And Vomiting  . Mustard Seed Nausea And Vomiting    Condiment- Mustard   . Other Hives    olives     Review of Systems  Respiratory: Negative for cough, shortness of breath and wheezing.   Cardiovascular: Positive for chest pain.  Gastrointestinal: Negative for abdominal pain and nausea.  Endocrine: Negative for polydipsia, polyphagia and polyuria.  Neurological: Positive for headaches (since last week ). Negative for dizziness.     Today's Vitals   12/06/18 0914  BP: 120/70  Pulse: 80  Temp: 98.3 F (36.8 C)  TempSrc: Tympanic  SpO2: 98%  Weight: 216 lb 6.4 oz (98.2 kg)  Height: 5\' 4"  (1.626 m)   Body mass index is 37.14 kg/m.   Objective:  Physical Exam Constitutional:      Appearance: She is well-developed.  Cardiovascular:     Heart sounds: Normal heart sounds. No murmur.  Pulmonary:     Effort: Pulmonary effort is normal. No respiratory distress.     Breath sounds: Normal breath sounds. No wheezing.  Abdominal:     General: Bowel sounds are normal.     Palpations: Abdomen is soft.  Musculoskeletal: Normal range of motion.  General: No swelling or tenderness.  Skin:    General: Skin is warm and dry.     Capillary Refill: Capillary refill takes less than 2 seconds.  Neurological:     Mental Status: She is alert.  Psychiatric:        Mood and Affect: Mood normal.        Speech: Speech normal.        Behavior: Behavior normal.         Assessment And Plan:     1. Chest discomfort  EKG is normal  Sounds like reflux encouraged to use peppermint oil for reflux she is not interested in any medications  2. Other complicated headache syndrome  Had headache last week improving now  Encouraged to make sure she is staying well hydrated  If worsens return call to office  Arnette Felts, FNP

## 2018-12-06 NOTE — Patient Instructions (Signed)
If symptoms worsen return call to office or go to ER for further evaluation  Get peppermint oil for the possible reflux

## 2018-12-27 ENCOUNTER — Encounter: Payer: Self-pay | Admitting: Nurse Practitioner

## 2018-12-30 ENCOUNTER — Ambulatory Visit: Payer: 59 | Admitting: Nurse Practitioner

## 2018-12-31 ENCOUNTER — Telehealth: Payer: Managed Care, Other (non HMO) | Admitting: Physician Assistant

## 2018-12-31 DIAGNOSIS — K219 Gastro-esophageal reflux disease without esophagitis: Secondary | ICD-10-CM

## 2018-12-31 MED ORDER — FAMOTIDINE 20 MG PO TABS: 20.0000 mg | ORAL_TABLET | Freq: Two times a day (BID) | ORAL | 0 refills | Status: DC | PRN

## 2018-12-31 NOTE — Progress Notes (Signed)
We are sorry that you are not feeling well.  Here is how we plan to help!  Based on what you shared with me it looks like you most likely have Gastroesophageal Reflux Disease (GERD)  Gastroesophageal reflux disease (GERD) happens when acid from your stomach flows up into the esophagus.  When acid comes in contact with the esophagus, the acid causes sorenss (inflammation) in the esophagus.  Over time, GERD may create small holes (ulcers) in the lining of the esophagus.  I recommend using over the counter Pepcid 20mg  twice a day for up to one month.   Your symptoms should improve in the next day or two.  You can use antacids as needed until symptoms resolve.  Call us if your heartburn worsens, you have trouble swallowing, weight loss, spitting up blood or recurrent vomiting.  Home Care: May include lifestyle changes such as weight loss, quitting smoking and alcohol consumption Avoid foods and drinks that make your symptoms worse, such as: Caffeine or alcoholic drinks Chocolate Peppermint or mint flavorings Garlic and onions Spicy foods Citrus fruits, such as oranges, lemons, or limes Tomato-based foods such as sauce, chili, salsa and pizza Fried and fatty foods Avoid lying down for 3 hours prior to your bedtime or prior to taking a nap Eat small, frequent meals instead of a large meals Wear loose-fitting clothing.  Do not wear anything tight around your waist that causes pressure on your stomach. Raise the head of your bed 6 to 8 inches with wood blocks to help you sleep.  Extra pillows will not help.  Seek Help Right Away If: You have pain in your arms, neck, jaw, teeth or back Your pain increases or changes in intensity or duration You develop nausea, vomiting or sweating (diaphoresis) You develop shortness of breath or you faint Your vomit is green, yellow, black or looks like coffee grounds or blood Your stool is red, bloody or black  These symptoms could be signs of other  problems, such as heart disease, gastric bleeding or esophageal bleeding.  Make sure you : Understand these instructions. Will watch your condition. Will get help right away if you are not doing well or get worse.  Your e-visit answers were reviewed by a board certified advanced clinical practitioner to complete your personal care plan.  Depending on the condition, your plan could have included both over the counter or prescription medications.  If there is a problem please reply once you have received a response from your provider.  Your safety is important to us.  If you have drug allergies check your prescription carefully.    You can use MyChart to ask questions about today's visit, request a non-urgent call back, or ask for a work or school excuse for 24 hours related to this e-Visit. If it has been greater than 24 hours you will need to follow up with your provider, or enter a new e-Visit to address those concerns.  You will get an e-mail in the next two days asking about your experience.  I hope that your e-visit has been valuable and will speed your recovery. Thank you for using e-visits.    ===View-only below this line===   ----- Message -----    From: Misty EasterlyStella D Lewis    Sent: 12/31/2018  1:28 PM EDT      To: E-Visit Mailing List Subject: E-Visit Submission: Heartburn  E-Visit Submission: Heartburn --------------------------------  Question: How long have you had heartburn? Answer:   A month to several  months  Question: How often do you experience heartburn? Answer:   All the time  Question: How long does your heartburn last? Answer:   I have it all the time  Question: Where do you feel the heartburn? Answer:   In the middle of my chest  Question: Does your heartburn wake you from sleep? Answer:   No  Question: Does your heartburn limit your ability to do things you need to do? Answer:   No  Question: Does your heartburn change depending on whether you are  sitting, standing, or lying down? Answer:   No  Question: Which of the following are you experiencing? Answer:   Frequent sore throat  Question: Do you have any of the following? Answer:   None of the above  Question: Do you have trouble swallowing? Answer:   No  Question: Do you feel full after eating less than usual? Answer:   No  Question: Do you have any of the following? Answer:   Chest pain  Question: Which of  the following makes the heartburn worse? Answer:   Stress  Question: Do you have any of the following? Answer:   None of the above  Question: Have you lost weight lately without trying? Answer:   No  Question: Have you ever been told you have the following? Answer:   None of the above  Question: Have you seen a healthcare provider for your heartburn? Answer:   I saw someone in the past month  Question: Have you taken any medicines to relieve your heartburn in the past? Answer:   Antacids (Tums, Rolaids, Malox, other)  Question: Have the medicines provided relief? Answer:   No  Question: Have you had any of the following for heartburn? Answer:   None of the above  Question: Please list your medication allergies that you may have ? (If 'none' , please list as 'none') Answer:   None  Question: Please list any additional comments  Answer:     Question: Are you pregnant? Answer:   I am confident that I am not pregnant  Question: Are you breastfeeding? Answer:   Yes  A total of 5-10 minutes was spent evaluating this patients questionnaire and formulating a plan of care.

## 2019-01-29 ENCOUNTER — Telehealth: Payer: Self-pay

## 2019-04-28 NOTE — Telephone Encounter (Signed)
Created in error

## 2019-05-06 ENCOUNTER — Telehealth: Payer: Self-pay | Admitting: Family

## 2019-05-06 DIAGNOSIS — Z20822 Contact with and (suspected) exposure to covid-19: Secondary | ICD-10-CM

## 2019-05-06 DIAGNOSIS — R6889 Other general symptoms and signs: Secondary | ICD-10-CM

## 2019-05-06 MED ORDER — AMOXICILLIN 500 MG PO TABS: 500.0000 mg | ORAL_TABLET | Freq: Two times a day (BID) | ORAL | 0 refills | Status: DC

## 2019-05-06 NOTE — Progress Notes (Signed)
E-Visit for Corona Virus Screening   Your current symptoms could be consistent with the coronavirus.  Many health care providers can now test patients at their office but not all are.  [facility] has multiple testing sites. For information on our COVID testing locations and hours go to [email or web address]  Please quarantine yourself while awaiting your test results.  We are enrolling you in our [facility] . Daily you will receive a questionnaire within the [facility] website. Our COVID 19 response team willl be monitoriing your responses daily.  Contact your primary care provider about your anxiety and reflux.  COVID-19 is a respiratory illness with symptoms that are similar to the flu. Symptoms are typically mild to moderate, but there have been cases of severe illness and death due to the virus. The following symptoms may appear 2-14 days after exposure: . Fever . Cough . Shortness of breath or difficulty breathing . Chills . Repeated shaking with chills . Muscle pain . Headache . Sore throat . New loss of taste or smell . Fatigue . Congestion or runny nose . Nausea or vomiting . Diarrhea  It is vitally important that if you feel that you have an infection such as this virus or any other virus that you stay home and away from places where you may spread it to others.  You should self-quarantine for 14 days if you have symptoms that could potentially be coronavirus or have been in close contact a with a person diagnosed with COVID-19 within the last 2 weeks. You should avoid contact with people age 31 and older.   You should wear a mask or cloth face covering over your nose and mouth if you must be around other people or animals, including pets (even at home). Try to stay at least 6 feet away from other people. This will protect the people around you.  You may also take acetaminophen (Tylenol) as needed for fever.   Reduce your risk of  any infection by using the same precautions used for avoiding the common cold or flu:  Marland Kitchen Wash your hands often with soap and warm water for at least 20 seconds.  If soap and water are not readily available, use an alcohol-based hand sanitizer with at least 60% alcohol.  . If coughing or sneezing, cover your mouth and nose by coughing or sneezing into the elbow areas of your shirt or coat, into a tissue or into your sleeve (not your hands). . Avoid shaking hands with others and consider head nods or verbal greetings only. . Avoid touching your eyes, nose, or mouth with unwashed hands.  . Avoid close contact with people who are sick. . Avoid places or events with large numbers of people in one location, like concerts or sporting events. . Carefully consider travel plans you have or are making. . If you are planning any travel outside or inside the [place], visit the CDC's Travelers' Health webpage for the latest health notices. . If you have some symptoms but not all symptoms, continue to monitor at home and seek medical attention if your symptoms worsen. . If you are having a medical emergency, call 911.  HOME CARE . Only take medications as instructed by your medical team. . Drink plenty of fluids and get plenty of rest. . A steam or ultrasonic humidifier can help if you have congestion.   GET HELP RIGHT AWAY IF YOU HAVE EMERGENCY WARNING SIGNS** FOR COVID-19. If you or someone is showing any  of these signs seek emergency medical care immediately. Call 911 or proceed to your closest emergency facility if: . You develop worsening high fever. . Trouble breathing . Bluish lips or face . Persistent pain or pressure in the chest . New confusion . Inability to wake or stay awake . You cough up blood. . Your symptoms become more severe  **This list is not all possible symptoms. Contact your medical provider for any symptoms that are sever or concerning to you.   MAKE SURE YOU   Understand these  instructions.  Will watch your condition.  Will get help right away if you are not doing well or get worse.  Your e-visit answers were reviewed by a board certified advanced clinical practitioner to complete your personal care plan.  Depending on the condition, your plan could have included both over the counter or prescription medications.  If there is a problem please reply once you have received a response from your provider.  Your safety is important to us.  If you have drug allergies check your prescription carefully.    You can use MyChart to ask questions about today's visit, request a non-urgent call back, or ask for a work or school excuse for 24 hours related to this e-Visit. If it has been greater than 24 hours you will need to follow up with your provider, or enter a new e-Visit to address those concerns. You will get an e-mail in the next two days asking about your experience.  I hope that your e-visit has been valuable and will speed your recovery. Thank you for using e-visits.   Greater than 5 minutes, yet less than 10 minutes of time have been spent researching, coordinating, and implementing care for this patient today.  Thank you for the details you included in the comment boxes. Those details are very helpful in determining the best course of treatment for you and help us to provide the best care.

## 2019-05-06 NOTE — Addendum Note (Signed)
Addended by: Dutch Quint B on: 05/06/2019 02:47 PM   Modules accepted: Orders

## 2019-05-12 ENCOUNTER — Other Ambulatory Visit: Payer: Self-pay

## 2019-05-12 DIAGNOSIS — Z20822 Contact with and (suspected) exposure to covid-19: Secondary | ICD-10-CM

## 2019-05-14 LAB — SPECIMEN STATUS REPORT

## 2019-05-14 LAB — NOVEL CORONAVIRUS, NAA: SARS-CoV-2, NAA: NOT DETECTED

## 2019-07-25 ENCOUNTER — Other Ambulatory Visit: Payer: Self-pay

## 2019-07-25 DIAGNOSIS — Z20822 Contact with and (suspected) exposure to covid-19: Secondary | ICD-10-CM

## 2019-07-26 LAB — NOVEL CORONAVIRUS, NAA: SARS-CoV-2, NAA: NOT DETECTED

## 2019-09-26 NOTE — L&D Delivery Note (Addendum)
Delivery Note Pt labored well to complete with urge to push. No epidural. She pushed for about 5-61mins and at 5:29 PM a viable female was delivered via Vaginal, Spontaneous (Presentation: Left Occiput Anterior).  APGAR: 7, 9; weight pending  After delivery of infants head, nuchal x 1 was reduced at the perineum over infants head. Maternal effort was minimal for next set of pushes for delivery of shoulders thus McRoberts maneuver was employed with success.  Anterior and then posterior shoulders delivered next; body easily followed.  Cord was quickly clamped and cut and baby handed off to awaiting nursery nurses due to low tone. HR palpated and normal.  Cord gas and blood obtained and placenta delivered. Pt kept placenta in cooler Placenta status: Spontaneous, Tomasa Blase .  Cord:3vc   with the following complications: nuchal x 1 .  Cord pH: pending  Anesthesia: None Episiotomy: none  Lacerations:  Second degree lac Suture Repair: 2.0 vicryl and 4-0 vicryl  Est. Blood Loss (mL):   Mom to postpartum.  Baby to Couplet care / Skin to Skin.  Cathrine Muster 06/17/2020, 6:03 PM

## 2019-11-25 LAB — OB RESULTS CONSOLE HIV ANTIBODY (ROUTINE TESTING): HIV: NONREACTIVE

## 2019-11-25 LAB — OB RESULTS CONSOLE ANTIBODY SCREEN: Antibody Screen: NEGATIVE

## 2019-11-25 LAB — OB RESULTS CONSOLE GC/CHLAMYDIA
Chlamydia: NEGATIVE
Gonorrhea: NEGATIVE

## 2019-11-25 LAB — OB RESULTS CONSOLE HEPATITIS B SURFACE ANTIGEN: Hepatitis B Surface Ag: NEGATIVE

## 2019-11-25 LAB — OB RESULTS CONSOLE RUBELLA ANTIBODY, IGM: Rubella: IMMUNE

## 2019-11-25 LAB — OB RESULTS CONSOLE ABO/RH: RH Type: POSITIVE

## 2019-11-25 LAB — OB RESULTS CONSOLE RPR: RPR: NONREACTIVE

## 2019-11-30 LAB — HM PAP SMEAR: HPV Aptima: NEGATIVE

## 2020-05-28 LAB — OB RESULTS CONSOLE GBS: GBS: NEGATIVE

## 2020-06-10 ENCOUNTER — Telehealth (HOSPITAL_COMMUNITY): Payer: Self-pay | Admitting: *Deleted

## 2020-06-10 ENCOUNTER — Encounter (HOSPITAL_COMMUNITY): Payer: Self-pay | Admitting: *Deleted

## 2020-06-10 NOTE — Telephone Encounter (Signed)
Preadmission screen  

## 2020-06-11 ENCOUNTER — Telehealth (HOSPITAL_COMMUNITY): Payer: Self-pay | Admitting: *Deleted

## 2020-06-11 NOTE — Telephone Encounter (Signed)
Preadmission screen  

## 2020-06-14 ENCOUNTER — Telehealth (HOSPITAL_COMMUNITY): Payer: Self-pay | Admitting: *Deleted

## 2020-06-14 NOTE — Telephone Encounter (Signed)
Preadmission screen  

## 2020-06-15 ENCOUNTER — Other Ambulatory Visit (HOSPITAL_COMMUNITY)
Admission: RE | Admit: 2020-06-15 | Discharge: 2020-06-15 | Disposition: A | Discharge status: Home / Self Care | Payer: Commercial Managed Care - PPO | Source: Ambulatory Visit | Attending: Obstetrics and Gynecology | Admitting: Obstetrics and Gynecology

## 2020-06-15 ENCOUNTER — Encounter (HOSPITAL_COMMUNITY): Payer: Self-pay | Admitting: *Deleted

## 2020-06-15 ENCOUNTER — Telehealth (HOSPITAL_COMMUNITY): Payer: Self-pay | Admitting: *Deleted

## 2020-06-15 DIAGNOSIS — Z01812 Encounter for preprocedural laboratory examination: Secondary | ICD-10-CM | POA: Insufficient documentation

## 2020-06-15 DIAGNOSIS — Z20822 Contact with and (suspected) exposure to covid-19: Secondary | ICD-10-CM | POA: Insufficient documentation

## 2020-06-15 LAB — SARS CORONAVIRUS 2 (TAT 6-24 HRS): SARS Coronavirus 2: NEGATIVE

## 2020-06-15 NOTE — Telephone Encounter (Signed)
Preadmission screen  

## 2020-06-17 ENCOUNTER — Inpatient Hospital Stay (HOSPITAL_COMMUNITY): Payer: Commercial Managed Care - PPO

## 2020-06-17 ENCOUNTER — Inpatient Hospital Stay (HOSPITAL_COMMUNITY)
Admission: AD | Admit: 2020-06-17 | Discharge: 2020-06-18 | DRG: 807 | Disposition: A | Discharge status: Home / Self Care | Payer: Commercial Managed Care - PPO | Attending: Obstetrics and Gynecology | Admitting: Obstetrics and Gynecology

## 2020-06-17 ENCOUNTER — Other Ambulatory Visit: Payer: Self-pay

## 2020-06-17 ENCOUNTER — Encounter (HOSPITAL_COMMUNITY): Payer: Self-pay | Admitting: Obstetrics and Gynecology

## 2020-06-17 DIAGNOSIS — D649 Anemia, unspecified: Secondary | ICD-10-CM | POA: Diagnosis present

## 2020-06-17 DIAGNOSIS — Z20822 Contact with and (suspected) exposure to covid-19: Secondary | ICD-10-CM | POA: Diagnosis present

## 2020-06-17 DIAGNOSIS — Z3A40 40 weeks gestation of pregnancy: Secondary | ICD-10-CM

## 2020-06-17 DIAGNOSIS — O26893 Other specified pregnancy related conditions, third trimester: Secondary | ICD-10-CM | POA: Diagnosis present

## 2020-06-17 DIAGNOSIS — O9902 Anemia complicating childbirth: Secondary | ICD-10-CM | POA: Diagnosis present

## 2020-06-17 DIAGNOSIS — Z3490 Encounter for supervision of normal pregnancy, unspecified, unspecified trimester: Secondary | ICD-10-CM

## 2020-06-17 LAB — CBC
HCT: 34.5 % — ABNORMAL LOW (ref 36.0–46.0)
Hemoglobin: 10.8 g/dL — ABNORMAL LOW (ref 12.0–15.0)
MCH: 28.6 pg (ref 26.0–34.0)
MCHC: 31.3 g/dL (ref 30.0–36.0)
MCV: 91.5 fL (ref 80.0–100.0)
Platelets: 229 10*3/uL (ref 150–400)
RBC: 3.77 MIL/uL — ABNORMAL LOW (ref 3.87–5.11)
RDW: 15 % (ref 11.5–15.5)
WBC: 6.4 10*3/uL (ref 4.0–10.5)
nRBC: 0 % (ref 0.0–0.2)

## 2020-06-17 LAB — TYPE AND SCREEN
ABO/RH(D): A POS
Antibody Screen: NEGATIVE

## 2020-06-17 LAB — RPR: RPR Ser Ql: NONREACTIVE

## 2020-06-17 MED ORDER — OXYCODONE HCL 5 MG PO TABS: 10.0000 mg | ORAL_TABLET | ORAL | Status: DC | PRN

## 2020-06-17 MED ORDER — SIMETHICONE 80 MG PO CHEW: 80.0000 mg | CHEWABLE_TABLET | ORAL | Status: DC | PRN

## 2020-06-17 MED ORDER — DIPHENHYDRAMINE HCL 25 MG PO CAPS: 25.0000 mg | ORAL_CAPSULE | Freq: Four times a day (QID) | ORAL | Status: DC | PRN

## 2020-06-17 MED ORDER — OXYCODONE HCL 5 MG PO TABS: 5.0000 mg | ORAL_TABLET | ORAL | Status: DC | PRN

## 2020-06-17 MED ORDER — OXYTOCIN BOLUS FROM INFUSION
333.0000 mL | Freq: Once | INTRAVENOUS | Status: AC
  Administered 2020-06-17: 333 mL via INTRAVENOUS

## 2020-06-17 MED ORDER — ONDANSETRON HCL 4 MG PO TABS: 4.0000 mg | ORAL_TABLET | ORAL | Status: DC | PRN

## 2020-06-17 MED ORDER — PRENATAL MULTIVITAMIN CH
1.0000 | ORAL_TABLET | Freq: Every day | ORAL | Status: DC
  Administered 2020-06-18: 1 via ORAL
  Filled 2020-06-17: qty 1

## 2020-06-17 MED ORDER — LACTATED RINGERS IV SOLN: 500.0000 mL | INTRAVENOUS | Status: DC | PRN

## 2020-06-17 MED ORDER — OXYCODONE-ACETAMINOPHEN 5-325 MG PO TABS: 2.0000 | ORAL_TABLET | ORAL | Status: DC | PRN

## 2020-06-17 MED ORDER — LACTATED RINGERS IV SOLN: INTRAVENOUS | Status: DC

## 2020-06-17 MED ORDER — TERBUTALINE SULFATE 1 MG/ML IJ SOLN: 0.2500 mg | Freq: Once | INTRAMUSCULAR | Status: DC | PRN

## 2020-06-17 MED ORDER — COCONUT OIL OIL: 1.0000 "application " | TOPICAL_OIL | Status: DC | PRN

## 2020-06-17 MED ORDER — ZOLPIDEM TARTRATE 5 MG PO TABS: 5.0000 mg | ORAL_TABLET | Freq: Every evening | ORAL | Status: DC | PRN

## 2020-06-17 MED ORDER — TETANUS-DIPHTH-ACELL PERTUSSIS 5-2.5-18.5 LF-MCG/0.5 IM SUSP: 0.5000 mL | Freq: Once | INTRAMUSCULAR | Status: DC

## 2020-06-17 MED ORDER — SENNOSIDES-DOCUSATE SODIUM 8.6-50 MG PO TABS
2.0000 | ORAL_TABLET | ORAL | Status: DC
  Administered 2020-06-17: 2 via ORAL
  Filled 2020-06-17: qty 2

## 2020-06-17 MED ORDER — BENZOCAINE-MENTHOL 20-0.5 % EX AERO
1.0000 "application " | INHALATION_SPRAY | CUTANEOUS | Status: DC | PRN
  Filled 2020-06-17: qty 56

## 2020-06-17 MED ORDER — SOD CITRATE-CITRIC ACID 500-334 MG/5ML PO SOLN: 30.0000 mL | ORAL | Status: DC | PRN

## 2020-06-17 MED ORDER — BUTORPHANOL TARTRATE 1 MG/ML IJ SOLN: 1.0000 mg | INTRAMUSCULAR | Status: DC | PRN

## 2020-06-17 MED ORDER — ONDANSETRON HCL 4 MG/2ML IJ SOLN: 4.0000 mg | Freq: Four times a day (QID) | INTRAMUSCULAR | Status: DC | PRN

## 2020-06-17 MED ORDER — WITCH HAZEL-GLYCERIN EX PADS: 1.0000 "application " | MEDICATED_PAD | CUTANEOUS | Status: DC | PRN

## 2020-06-17 MED ORDER — IBUPROFEN 600 MG PO TABS
600.0000 mg | ORAL_TABLET | Freq: Four times a day (QID) | ORAL | Status: DC
  Administered 2020-06-17 – 2020-06-18 (×3): 600 mg via ORAL
  Filled 2020-06-17 (×3): qty 1

## 2020-06-17 MED ORDER — ONDANSETRON HCL 4 MG/2ML IJ SOLN: 4.0000 mg | INTRAMUSCULAR | Status: DC | PRN

## 2020-06-17 MED ORDER — DIBUCAINE (PERIANAL) 1 % EX OINT: 1.0000 "application " | TOPICAL_OINTMENT | CUTANEOUS | Status: DC | PRN

## 2020-06-17 MED ORDER — LIDOCAINE HCL (PF) 1 % IJ SOLN
30.0000 mL | INTRAMUSCULAR | Status: AC | PRN
  Administered 2020-06-17: 30 mL via SUBCUTANEOUS
  Filled 2020-06-17: qty 30

## 2020-06-17 MED ORDER — ACETAMINOPHEN 325 MG PO TABS: 650.0000 mg | ORAL_TABLET | ORAL | Status: DC | PRN

## 2020-06-17 MED ORDER — OXYCODONE-ACETAMINOPHEN 5-325 MG PO TABS: 1.0000 | ORAL_TABLET | ORAL | Status: DC | PRN

## 2020-06-17 MED ORDER — OXYTOCIN-SODIUM CHLORIDE 30-0.9 UT/500ML-% IV SOLN
2.5000 [IU]/h | INTRAVENOUS | Status: DC
  Administered 2020-06-17: 2.5 [IU]/h via INTRAVENOUS

## 2020-06-17 MED ORDER — OXYTOCIN-SODIUM CHLORIDE 30-0.9 UT/500ML-% IV SOLN
1.0000 m[IU]/min | INTRAVENOUS | Status: DC
  Administered 2020-06-17: 2 m[IU]/min via INTRAVENOUS
  Filled 2020-06-17: qty 500

## 2020-06-17 NOTE — Plan of Care (Signed)

## 2020-06-17 NOTE — H&P (Signed)
Misty Lewis is a 34 y.o.G42P1011 female presenting for scheduled iol due to favorable cervix post EDC. Pt is dated per LMP which was confirmed with a 10week [place]. Pt declined materniti test. Her essential panel was neg in 2018. She had a benign prenatal course complicated only by borderline anemia. She received Tdap vaccine. She would like to encapsulate her placenta OB History    Gravida  3   Para  1   Term  1   Preterm      AB  1   Living  1     SAB  1   TAB      Ectopic      Multiple  0   Live Births  1          Past Medical History:  Diagnosis Date  . Eczema   . Migraine    mirena out 2015 so no longer have ha  . Seasonal allergies    No past surgical history on file. Family History: family history includes Diabetes in her mother; Hypertension in her mother; Lupus in her maternal aunt; Lymphoma in her maternal aunt. Social History:  reports that she has never smoked. She has never used smokeless tobacco. She reports that she does not drink alcohol and does not use drugs.     Maternal Diabetes: No Genetic Screening: Declined Maternal Ultrasounds/Referrals: Normal Fetal Ultrasounds or other Referrals:  None Maternal Substance Abuse:  No Significant Maternal Medications:  None Significant Maternal Lab Results:  Group B Strep negative Other Comments:  None  Review of Systems  Constitutional: Positive for activity change and fatigue. Negative for fever.  Eyes: Negative for photophobia and visual disturbance.  Respiratory: Negative for chest tightness and shortness of breath.   Cardiovascular: Negative for chest pain, palpitations and leg swelling.  Gastrointestinal: Negative for diarrhea.  Genitourinary: Positive for pelvic pain.  Musculoskeletal: Positive for back pain.  Neurological: Negative for light-headedness and headaches.  Psychiatric/Behavioral: The patient is nervous/anxious.    Maternal Medical History:  Reason for admission: Favorable cervix  at term  Contractions: Onset was more than 2 days ago.   Frequency: irregular.   Perceived severity is moderate.    Fetal activity: Perceived fetal activity is normal.    Prenatal complications: no prenatal complications Prenatal Complications - Diabetes: none.      unknown if currently breastfeeding. Maternal Exam:  Uterine Assessment: Contraction strength is moderate.  Contraction frequency is irregular.   Abdomen: Patient reports generalized tenderness.  Estimated fetal weight is AGA.   Fetal presentation: vertex  Introitus: Normal vulva. Vulva is negative for condylomata and lesion.  Normal vagina.  Vagina is negative for condylomata.  Pelvis: adequate for delivery.   Cervix: Cervix evaluated by digital exam.     Fetal Exam Fetal Monitor Review: Baseline rate: 140.      Physical Exam Vitals reviewed. Exam conducted with a chaperone present.  Constitutional:      Appearance: Normal appearance.  Cardiovascular:     Pulses: Normal pulses.  Pulmonary:     Effort: Pulmonary effort is normal.     Breath sounds: Normal breath sounds.  Abdominal:     Tenderness: There is generalized abdominal tenderness.  Genitourinary:    General: Normal vulva.  Vulva is no lesion.  Musculoskeletal:        General: Normal range of motion.     Cervical back: Normal range of motion.  Skin:    General: Skin is warm.  Capillary Refill: Capillary refill takes 2 to 3 seconds.  Neurological:     General: No focal deficit present.     Mental Status: She is alert and oriented to person, place, and time. Mental status is at baseline.  Psychiatric:        Mood and Affect: Mood normal.        Behavior: Behavior normal.        Thought Content: Thought content normal.        Judgment: Judgment normal.     Prenatal labs: ABO, Rh: A/Positive/-- (03/02 0000) Antibody: Negative (03/02 0000) Rubella: Immune (03/02 0000) RPR: Nonreactive (03/02 0000)  HBsAg: Negative (03/02 0000)   HIV: Non-reactive (03/02 0000)  GBS: Negative/-- (09/03 0000)   Assessment/Plan: 68LE [id number] female for iol due to favorable cervix post EDC -Admit -Sars covid neg -Labor diet -GBS neg -Start on pitocin/AROM for induction -Anticipate svd  Janean Sark Shelagh Rayman 06/17/2020, 5:06 AM

## 2020-06-17 NOTE — Progress Notes (Signed)
Patient ID: Misty Lewis, female   DOB: 12-06-1985, 35 y.o.   MRN: [id number] Pt doing well with no complaints except sore pelvis. +FMs VSS GEN - NAD EFM - cat 1, 130 TOCO - rare contraction SVE 4/75/-2, post  A/P: AROM performed with clear fluid noted.         Watch for an hour then start pitocin if no spontaneous labor         Pain control prn

## 2020-06-18 LAB — CBC
HCT: 30.8 % — ABNORMAL LOW (ref 36.0–46.0)
Hemoglobin: 10 g/dL — ABNORMAL LOW (ref 12.0–15.0)
MCH: 29.6 pg (ref 26.0–34.0)
MCHC: 32.5 g/dL (ref 30.0–36.0)
MCV: 91.1 fL (ref 80.0–100.0)
Platelets: 222 10*3/uL (ref 150–400)
RBC: 3.38 MIL/uL — ABNORMAL LOW (ref 3.87–5.11)
RDW: 15.2 % (ref 11.5–15.5)
WBC: 12.5 10*3/uL — ABNORMAL HIGH (ref 4.0–10.5)
nRBC: 0 % (ref 0.0–0.2)

## 2020-06-18 MED ORDER — IBUPROFEN 800 MG PO TABS: 800.0000 mg | ORAL_TABLET | Freq: Three times a day (TID) | ORAL | 1 refills | Status: DC | PRN

## 2020-06-18 NOTE — Discharge Summary (Signed)
Postpartum Discharge Summary  Date of Service updated     Patient Name: Misty Lewis DOB: [birth year] MRN: [id number]  Date of admission: 06/17/2020 Delivery date:06/17/2020  Delivering provider: Carlynn Purl [facility]  Date of discharge: 06/18/2020  Admitting diagnosis: Term pregnancy [Z34.90] Intrauterine pregnancy: [redacted]w[redacted]d    Secondary diagnosis:  Active Problems:   Term pregnancy  Additional problems: none    Discharge diagnosis: Term Pregnancy Delivered                                              Post partum procedures:none Augmentation: AROM and Pitocin Complications: None  Hospital course: Induction of Labor With Vaginal Delivery   34y.o. yo [id number] [id number] admitted to the hospital 06/17/2020 for induction of labor.  Indication for induction: Favorable cervix at term.  Patient had an uncomplicated labor course as follows: Membrane Rupture Time/Date: 9:20 AM ,06/17/2020   Delivery Method:Vaginal, Spontaneous  Episiotomy: None  Lacerations:  2nd degree  Details of delivery can be found in separate delivery note.  Patient had a routine postpartum course. Patient is discharged home 06/18/20.  Newborn Data: Birth date:06/17/2020  Birth time:5:29 PM  Gender:Female  Living status:Living  Apgars:7 ,9  Weight:3351 g   Magnesium Sulfate received: No BMZ received: No Rhophylac:No MMR:No T-DaP:Given prenatally Flu: No Transfusion:No  Physical exam  Vitals:   06/17/20 2100 06/18/20 0100 06/18/20 0500 06/18/20 0836  BP: 133/81 111/73 (!) 96/59 105/85  Pulse: 74 65 75 74  Resp: '18 17 18   ' Temp: 98.3 F (36.8 C) 97.9 F (36.6 C) 98.5 F (36.9 C) 98.6 F (37 C)  TempSrc: Oral Oral Oral Oral  SpO2: 96% 100% 98%   Weight:      Height:       General: alert, cooperative and no distress Lochia: appropriate Uterine Fundus: firm Incision: N/A DVT Evaluation: No evidence of DVT seen on physical exam. Negative Homan's sign. No cords or calf  tenderness. Labs: Lab Results  Component Value Date   WBC 12.5 (H) 06/18/2020   HGB 10.0 (L) 06/18/2020   HCT 30.8 (L) 06/18/2020   MCV 91.1 06/18/2020   PLT 222 06/18/2020   CMP Latest Ref Rng & Units 10/07/2018  Glucose 65 - 99 mg/dL 84  BUN 6 - 20 mg/dL 9  Creatinine 0.57 - 1.00 mg/dL 0.68  Sodium 134 - 144 mmol/L 138  Potassium 3.5 - 5.2 mmol/L 4.4  Chloride 96 - 106 mmol/L 100  CO2 20 - 29 mmol/L 24  Calcium 8.7 - 10.2 mg/dL 10.0  Total Protein 6.0 - 8.5 g/dL 7.6  Total Bilirubin 0.0 - 1.2 mg/dL <0.2  Alkaline Phos 39 - 117 IU/L 137(H)  AST 0 - 40 IU/L 17  ALT 0 - 32 IU/L 8   Edinburgh Score: Edinburgh Postnatal Depression Scale Screening Tool 05/03/2017  I have been able to laugh and see the funny side of things. 0  I have looked forward with enjoyment to things. 0  I have blamed myself unnecessarily when things went wrong. 0  I have been anxious or worried for no good reason. 2  I have felt scared or panicky for no good reason. 2  Things have been getting on top of me. 0  I have been so unhappy that I have had difficulty sleeping. 0  I have felt sad or  miserable. 0  I have been so unhappy that I have been crying. 0  The thought of harming myself has occurred to me. 0  Edinburgh Postnatal Depression Scale Total 4      After visit meds:  Allergies as of 06/18/2020      Reactions   Coconut Flavor Anaphylaxis, Nausea And Vomiting   Mushroom Extract Complex Anaphylaxis, Nausea And Vomiting   mushrooms   Cherry Nausea And Vomiting   Mustard Seed Nausea And Vomiting   Condiment- Mustard    Other Hives   olives      Medication List    STOP taking these medications   acetaminophen 500 MG tablet Commonly known as: TYLENOL   famotidine 20 MG tablet Commonly known as: PEPCID     TAKE these medications   amoxicillin 500 MG capsule Commonly known as: AMOXIL Take 500 mg by mouth every 6 (six) hours. 7 day supply What changed: Another medication with the same  name was removed. Continue taking this medication, and follow the directions you see here.   ibuprofen 800 MG tablet Commonly known as: ADVIL Take 1 tablet (800 mg total) by mouth every 8 (eight) hours as needed.   prenatal multivitamin Tabs tablet Take 1 tablet by mouth daily at 12 noon.        Discharge home in stable condition Infant Feeding: Breast Infant Disposition:home with mother Discharge instruction: per After Visit Summary and Postpartum booklet. Activity: Advance as tolerated. Pelvic rest for 6 weeks.  Diet: routine diet Anticipated Birth Control: declines Postpartum Appointment:6 weeks Future Appointments:No future appointments. Follow up Visit:      06/18/2020 Deliah Boston, MD

## 2020-06-18 NOTE — Progress Notes (Signed)
POSTPARTUM PROGRESS NOTE  Post Partum Day #1  Subjective:  No acute events overnight.  Pt denies problems with ambulating, voiding or po intake.  She denies nausea or vomiting.  Pain is well controlled. Lochia Minimal. Desires discharge home if possible  Objective: Blood pressure 105/85, pulse 74, temperature 98.6 F (37 C), temperature source Oral, resp. rate 18, height 5\' 4"  (1.626 m), weight 97 kg, SpO2 98 %, unknown if currently breastfeeding.  Physical Exam:  General: alert, cooperative and no distress Lochia:normal flow Chest: CTAB Heart: RRR no m/r/g Abdomen: +BS, soft, nontender Uterine Fundus: firm, 3cm below umbilicus GU: suture intact, healing well, no purulent drainage Extremities: neg edema, neg calf TTP BL, neg Homans BL  Recent Labs    06/17/20 0904 06/18/20 0621  HGB 10.8* 10.0*  HCT 34.5* 30.8*    Assessment/Plan:  ASSESSMENT: Misty Lewis is a 34 y.o. 34 s/p SVD @ [redacted]w[redacted]d. PNC c/b borderline anemia.   Discharge home and Breastfeeding   LOS: 1 day

## 2020-06-18 NOTE — Lactation Note (Signed)
This note was copied from a baby's chart. Lactation Consultation Note  Patient Name: Misty Lewis [id number] Date: 06/18/2020 Reason for consult: Initial assessment  Baby is 21 hours old, P 2 , experienced and stopped breast feeding her 1st at  2 1/2 years without challenges.  LC updated and reviewed the doc flow sheets with  Mom and updated.  Baby has been to the breast consistently, and per mom the feedings have been comfortable with swallows. Voids and stools remarkable for baby's age.  Latch Scores have been 9-10's.  Mom denies soreness, sore nipple and engorgement prevention and tx reviewed.  Per mom has a HAKKA and a DEBP at  Home.  LC discussed the importance of STS Feedings until the baby is back to birth weight, gaining steadily and can stay awake for majority of the feedings.  Nutritive vs non- nutritive feedings and the importance of watching the baby for hanging out latched.  LC provided the [facility] pamphlet with phone numbers.  Per mom feels comfortable will latching and LC encouraged to call if needed.    Maternal Data Has patient been taught Hand Expression?:  (per mom familiar ) Does the patient have breastfeeding experience prior to this delivery?: Yes  Feeding Feeding Type: Breast Fed (baby had fed at 1345 and was just finishing up 5 mins )  LATCH Score                   Interventions Interventions: Breast feeding basics reviewed  Lactation Tools Discussed/Used WIC Program: No   Consult Status Consult Status: Complete Date: 06/18/20    [name] 06/18/2020, 2:30 PM

## 2020-06-28 ENCOUNTER — Telehealth: Payer: Self-pay

## 2020-06-28 NOTE — Telephone Encounter (Signed)
Per JM: Call to schedule physical/follow up in November  LVM for pt to call the office to schedule a physical appt for sometime in November

## 2020-09-28 ENCOUNTER — Other Ambulatory Visit: Payer: Commercial Managed Care - PPO

## 2020-09-28 DIAGNOSIS — Z20822 Contact with and (suspected) exposure to covid-19: Secondary | ICD-10-CM

## 2020-09-30 LAB — SARS-COV-2, NAA 2 DAY TAT

## 2020-09-30 LAB — NOVEL CORONAVIRUS, NAA: SARS-CoV-2, NAA: NOT DETECTED

## 2020-10-04 ENCOUNTER — Encounter: Payer: Self-pay | Admitting: Nurse Practitioner

## 2020-10-04 ENCOUNTER — Ambulatory Visit (INDEPENDENT_AMBULATORY_CARE_PROVIDER_SITE_OTHER): Payer: Commercial Managed Care - PPO | Admitting: Nurse Practitioner

## 2020-10-04 ENCOUNTER — Other Ambulatory Visit: Payer: Self-pay

## 2020-10-04 VITALS — BP 120/84 | HR 81 | Temp 98.0°F | Ht 64.0 in | Wt 215.0 lb

## 2020-10-04 DIAGNOSIS — F419 Anxiety disorder, unspecified: Secondary | ICD-10-CM | POA: Diagnosis not present

## 2020-10-04 DIAGNOSIS — J3489 Other specified disorders of nose and nasal sinuses: Secondary | ICD-10-CM | POA: Diagnosis not present

## 2020-10-04 DIAGNOSIS — K219 Gastro-esophageal reflux disease without esophagitis: Secondary | ICD-10-CM

## 2020-10-04 LAB — POCT URINE PREGNANCY: Preg Test, Ur: NEGATIVE

## 2020-10-04 MED ORDER — FLUTICASONE PROPIONATE 50 MCG/ACT NA SUSP: 2.0000 | Freq: Every day | NASAL | 2 refills | Status: DC

## 2020-10-04 MED ORDER — GAVISCON 80-14.2 MG PO CHEW: 1.0000 | CHEWABLE_TABLET | Freq: Two times a day (BID) | ORAL | 2 refills | Status: DC | PRN

## 2020-10-04 NOTE — Patient Instructions (Signed)
COVID-19 Vaccine Information can be found at: [email or web address] For questions related to vaccine distribution or appointments, please email [email or web address] or call [phone].    

## 2020-10-04 NOTE — Progress Notes (Signed)
I,Yamilka Roman [employer] as a Neurosurgeon for [employer], FNP.,have documented all relevant documentation on the behalf of Arnette Felts, FNP,as directed by  Arnette Felts, FNP while in the presence of Arnette Felts, FNP. This visit occurred during the SARS-CoV-2 public health emergency.  Safety protocols were in place, including screening questions prior to the visit, additional usage of staff PPE, and extensive cleaning of exam room while observing appropriate contact time as indicated for disinfecting solutions.  Subjective:     Patient ID: Misty Lewis , female    DOB: 01/17/1986 , 35 y.o.   MRN: [id number]   Chief Complaint  Patient presents with  . URI    Patient stated she has some sinus pressure that started on the 12/22. She is also having some drainage that is causing her to have a sore throat.  Marland Kitchen Anxiety    HPI  Patient presents today for sinus issues.  She reports since March 2020 she has been having sinus problems. She was plant based diet from 2018 to 2019 and then again in 2020 the beginning of the year. She started back on a regular diet in April of 2021.  She stated she did a covid test last week and it came back negative.    She was given a prescription for acid reflux but never picked it up.    She was last checked for covid last Tuesday. Will usually last for about one month. Started on 12/22, headaches have gone and the sinus pressure better yesterday started with nasal drainage and sore throat this morning.    She is working with [employer].   She is postpartum 5 months, she is breast feeding. Does not feel her anxiety is interrupting her daily lifestyle.   URI  This is a chronic problem. The current episode started more than 1 year ago. Associated symptoms include chest pain (chest tightness), sinus pain and a sore throat (slight sore throat). Pertinent negatives include no headaches.  Anxiety Presents for initial visit. Onset was 1 to 5 years ago (she  reports since April 2020 ). Symptoms include chest pain (chest tightness) and excessive worry. Patient reports no dizziness or restlessness. Symptoms occur constantly. The quality of sleep is good.   Her past medical history is significant for anxiety/panic attacks. There is no history of anemia. Treatments tried: yoga and exercising more      Past Medical History:  Diagnosis Date  . Eczema   . Migraine    mirena out 2015 so no longer have ha  . Seasonal allergies      Family History  Problem Relation Age of Onset  . Diabetes Mother   . Hypertension Mother   . Lupus Maternal Aunt   . Lymphoma Maternal Aunt   . Diabetes Maternal Aunt   . Hyperlipidemia Maternal Aunt      Current Outpatient Medications:  .  Alum Hydroxide-Mag Trisilicate (GAVISCON) 80-14.2 MG CHEW, Chew 1 tablet by mouth 2 (two) times daily as needed., Disp: 60 tablet, Rfl: 2 .  Cholecalciferol (VITAMIN D3) 50 MCG (2000 UT) CAPS, Take 1 capsule by mouth daily at 2 am., Disp: , Rfl:  .  fluticasone (FLONASE) 50 MCG/ACT nasal spray, Place 2 sprays into both nostrils daily., Disp: 18.2 g, Rfl: 2 .  Prenatal Vit-Fe Fumarate-FA (PRENATAL MULTIVITAMIN) TABS tablet, Take 1 tablet by mouth daily at 12 noon. , Disp: , Rfl:    Allergies  Allergen Reactions  . Coconut Flavor Anaphylaxis and Nausea And Vomiting  .  Mushroom Extract Complex Anaphylaxis and Nausea And Vomiting    mushrooms  . Cherry Nausea And Vomiting  . Mustard Seed Nausea And Vomiting    Condiment- Mustard   . Other Hives    olives     Review of Systems  HENT: Positive for sinus pain and sore throat (slight sore throat).   Respiratory: Negative.   Cardiovascular: Positive for chest pain (chest tightness). Negative for leg swelling.  Gastrointestinal:       Heartburn intermittent. She is not able to identify one item causing this problem  Neurological: Negative for dizziness and headaches.  Psychiatric/Behavioral: Negative.      Today's Vitals    10/04/20 1041  BP: 120/84  Pulse: 81  Temp: 98 F (36.7 C)  TempSrc: Oral  Weight: 215 lb (97.5 kg)  Height: 5\' 4"  (1.626 m)  PainSc: 0-No pain   Body mass index is 36.9 kg/m.   Objective:  Physical Exam Vitals reviewed.  Constitutional:      General: She is not in acute distress.    Appearance: Normal appearance. She is obese.  Cardiovascular:     Rate and Rhythm: Normal rate and regular rhythm.     Pulses: Normal pulses.     Heart sounds: Normal heart sounds. No murmur heard.   Pulmonary:     Effort: Pulmonary effort is normal. No respiratory distress.     Breath sounds: Normal breath sounds. No wheezing.  Neurological:     General: No focal deficit present.     Mental Status: She is alert and oriented to person, place, and time.     Cranial Nerves: No cranial nerve deficit.  Psychiatric:        Mood and Affect: Mood normal.        Behavior: Behavior normal.        Thought Content: Thought content normal.        Judgment: Judgment normal.          Assessment And Plan:     1. Sinus pressure  She has pressure to her ethmoid sinuses  She is to take flonase nasal spray and to have a sinus xray  She had a negative pregnancy test - DG Sinuses Complete; Future - fluticasone (FLONASE) 50 MCG/ACT nasal spray; Place 2 sprays into both nostrils daily.  Dispense: 18.2 g; Refill: 2 - POCT Urine Pregnancy - Novel Coronavirus, NAA ([facility])  2. Anxiety  Reports this as ongoing.   She is encouraged to take magnesium which is safe for lactation and to be mindful of bowel movements at this can be constipating  I will refer to a counselor - Ambulatory referral to Psychology  3. Gastroesophageal reflux disease without esophagitis  She is encouraged to avoid fatty foods and dairy  She will try gaviscon which is safe while lactating.   If not improved she may need to see GI, this has been long term but has not taken any medications recently - Alum Hydroxide-Mag  Trisilicate (GAVISCON) 80-14.2 MG CHEW; Chew 1 tablet by mouth 2 (two) times daily as needed.  Dispense: 60 tablet; Refill: 2     Patient was given opportunity to ask questions. Patient verbalized understanding of the plan and was able to repeat key elements of the plan. All questions were answered to their satisfaction.  , FNP   I, Arnette Felts, FNP, have reviewed all documentation for this visit. The documentation on 10/04/20 for the exam, diagnosis, procedures, and orders are all accurate and complete.  THE PATIENT IS ENCOURAGED TO PRACTICE SOCIAL DISTANCING DUE TO THE COVID-19 PANDEMIC.   

## 2020-10-06 LAB — SARS-COV-2, NAA 2 DAY TAT

## 2020-10-06 LAB — NOVEL CORONAVIRUS, NAA: SARS-CoV-2, NAA: NOT DETECTED

## 2020-10-14 ENCOUNTER — Ambulatory Visit: Payer: Commercial Managed Care - PPO | Admitting: Physical Therapy

## 2020-10-19 ENCOUNTER — Other Ambulatory Visit: Payer: Self-pay

## 2020-10-19 ENCOUNTER — Ambulatory Visit: Payer: Commercial Managed Care - PPO | Attending: Obstetrics and Gynecology | Admitting: Physical Therapy

## 2020-10-19 DIAGNOSIS — M6281 Muscle weakness (generalized): Secondary | ICD-10-CM | POA: Insufficient documentation

## 2020-10-19 DIAGNOSIS — R252 Cramp and spasm: Secondary | ICD-10-CM | POA: Insufficient documentation

## 2020-10-19 NOTE — Therapy (Signed)
[facility] [address] [place], [place], [address] Phone: [phone]   Fax:  [phone]  Physical Therapy Evaluation  Patient Details  Name: Misty Lewis MRN: [id number] Date of Birth: December 14, 1985 Referring Provider (PT): Pryor Ochoa, [place]   Encounter Date: 10/19/2020   PT End of Session - 10/19/20 1121    Visit Number 1    Date for PT Re-Evaluation 01/11/21    Authorization Type UHC    PT Start Time 1021    PT Stop Time 1100    PT Time Calculation (min) 39 min    Activity Tolerance Patient tolerated treatment well    Behavior During Therapy [facility] for tasks assessed/performed           Past Medical History:  Diagnosis Date  . Eczema   . Migraine    mirena out 2015 so no longer have ha  . Seasonal allergies     No past surgical history on file.  There were no vitals filed for this visit.    Subjective Assessment - 10/19/20 1023    Subjective Pt is s/p pregnancy and vaginal delivery Sept 2021.  Pt had Lt leg pain during pregnancy and got worse after, and also started having pain in pubic symphasis.  All pain is getting worse.    Limitations Standing    How long can you stand comfortably? 1.5 hours    Currently in Pain? Yes    Pain Score 7     Pain Location Back    Pain Orientation Left;Lower   sometimes wraps around the back   Pain Descriptors / Indicators Tightness    Pain Type Chronic pain    Pain Radiating Towards back of the leg and stops mid calf    Pain Onset More than a month ago    Pain Frequency Intermittent    Aggravating Factors  standing and worsens throughout the day, pubic syphasis hurts in the middle of the night or first thing in the morning    Pain Relieving Factors 20-30 minuts of sitting    Effect of Pain on Daily Activities just push through and then cannot do as much around the home              [facility] PT Assessment - 10/19/20 0001      Assessment   Medical  Diagnosis M86.8X8 (ICD-10-CM) - Other osteomyelitis, other site    Referring Provider (PT) Pryor Ochoa, DO    Onset Date/Surgical Date --   September 2021   Prior Therapy No      Precautions   Precautions None      Restrictions   Weight Bearing Restrictions No      Balance Screen   Has the patient fallen in the past 6 months No   did trip over something not long ago     Cabin crew Private residence    Living Arrangements Spouse/significant other;Children   70 yo; 6 month old     Prior Function   Level of Independence Independent    Vocation Full time employment   [employer] and work from home   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 so she is up and down throughout the day    Leisure pelaton      Cognition   Overall Cognitive Status Within Functional Limits for tasks assessed      Functional Tests   Functional tests Single leg stance  Single Leg Stance   Comments lumbar movement and SI joint stays locked on hip flexion side; no movement or trendelenburge noted on support limb      Posture/Postural Control   Posture/Postural Control Postural limitations    Postural Limitations Forward head;Anterior pelvic tilt;Increased lumbar lordosis   Rt ilium superior to Lt side; Rt trunk slight sidebend     ROM / Strength   AROM / PROM / Strength AROM;Strength      AROM   Overall AROM Comments fwd flexion lumbar 50%      Strength   Overall Strength Comments LE 5/5; core weakness transverse abs and lumbar multifidi      Flexibility   Soft Tissue Assessment /Muscle Length yes    Hamstrings 70%      Palpation   Palpation comment lumbar paraspinals tight; pubic symphasis TTP;      Special Tests   Other special tests ASLR with minimal improvement lumbar multifidi support      Ambulation/Gait   Gait Pattern Trunk flexed                      Objective measurements completed on examination: See above findings.      Pelvic Floor Special Questions - 10/19/20 0001    Prior Pelvic/Prostate Exam Yes    Are you Pregnant or attempting pregnancy? No    Prior Pregnancies Yes    Number of Pregnancies 2    Number of Vaginal Deliveries 2    Episiotomy Performed --   2nd deg tearing both   Diastasis Recti 1+ finger throughout and slightly more depth around umbilicus and TTP throughout    Currently Sexually Active Yes    Is this Painful No    Urinary Leakage No    Urinary urgency No    Urinary frequency nrmal    Fecal incontinence No    Falling out feeling (prolapse) No    Perineal Body/Introitus  Descended    External Palpation TTP ishciocavernosis muscles    Pelvic Floor Internal Exam pt identitiy confirmed and informed consent given to perform internal assessment    Exam Type Vaginal    Palpation TTP bilat levators with more tenderness at pubic attatchment    Strength Flicker    Strength # of reps 3    Strength # of seconds 3    Tone high                      PT Short Term Goals - 10/19/20 1122      PT SHORT TERM GOAL #1   Title pt will report at least 25% less pain    Time 4    Period Weeks    Status New    Target Date 11/16/20      PT SHORT TERM GOAL #2   Title ind with intial HEP    Time 4    Period Weeks    Status New    Target Date 11/16/20             PT Long Term Goals - 10/19/20 1030      PT LONG TERM GOAL #1   Title Be able to do housework for at least 2.5 hours without increased pain    Time 12    Period Weeks    Status New    Target Date 01/11/21      PT LONG TERM GOAL #2   Title Pt will be able to  workout on pelaton bike for 30 minutes    Baseline unable to use at all    Time 12    Period Weeks    Status New    Target Date 01/11/21      PT LONG TERM GOAL #3   Title Pt will be ind with advanced HEP    Time 12    Period Weeks    Status New    Target Date 01/11/21      PT LONG TERM GOAL #4   Title Pt will report at least 70% reduction in  pain overall and understanding of how to manage pain at home for continued improvement    Time 12    Period Weeks    Status New    Target Date 01/11/21                  Plan - 10/19/20 1106    Clinical Impression Statement Pt presents to skilled PT due to low back and pubic symphasis pain that she has experienced since pregnancy and worsening after delivery of her second child.  Pt has radicular pain down the left LE.  Pt demonstrates SLS without deviations in pelvis but SI joints are not unlocking and getting excess movement in lumbar spine.  Pt is unable to fully fwd flex due to lumbar and hamstring tightness as noted above.  Pt has anterior pelvic tilt standing and abdominal weakness with diastasis rectus abdominus of slightly more than one finger.  She is TTP throughout the rectus ab separation, pubic symphasis is TTP and bilateral levators and ischiocavernosis is TTP.  Pt presents with the above impairments that can be addressed with skilled therapy so that she can restore function and reduce pain so that she can improve function and perform normal ADLs    Personal Factors and Comorbidities Comorbidity 2;Time since onset of injury/illness/exacerbation    Comorbidities 2nd deg tear x 2; vaginal delivery x 2    Examination-Activity Limitations Stand    Examination-Participation Restrictions Community Activity    Stability/Clinical Decision Making Evolving/Moderate complexity    Clinical Decision Making Moderate    Rehab Potential Excellent    PT Frequency 1x / week    PT Duration 12 weeks    PT Treatment/Interventions ADLs/Self Care Home Management;Biofeedback;Cryotherapy;Academic librarian;Therapeutic activities;Therapeutic exercise;Neuromuscular re-education;Patient/family education;Manual techniques;Taping;Dry needling;Passive range of motion    PT Next Visit Plan stretch and breathing - lumbar, h/s, pelvic floor, maybe use ice for pain management    PT  Home Exercise Plan explained and demo h/s and SKTC stetches    Consulted and Agree with Plan of Care Patient           Patient will benefit from skilled therapeutic intervention in order to improve the following deficits and impairments:  Abnormal gait,Pain,Postural dysfunction,Impaired flexibility,Increased fascial restricitons,Decreased strength,Decreased coordination,Increased muscle spasms  Visit Diagnosis: Muscle weakness (generalized)  Cramp and spasm     Problem List Patient Active Problem List   Diagnosis Date Noted  . Term pregnancy 06/17/2020  . Chest discomfort 12/06/2018  . Indication for care in labor or delivery 05/01/2017  . SVD (spontaneous vaginal delivery) 05/01/2017    Junious Silk, PT 10/19/2020, 11:40 AM  [facility] [address] [place], [place], [address] Phone: [phone]   Fax:  [phone]  Name: Misty Lewis MRN: [id number] Date of Birth: 07/09/1986

## 2020-10-19 NOTE — Addendum Note (Signed)
Addended by: Beatris Si on: 10/19/2020 11:42 AM   Modules accepted: Orders

## 2020-11-10 ENCOUNTER — Encounter: Payer: Self-pay | Admitting: Nurse Practitioner

## 2020-11-26 ENCOUNTER — Other Ambulatory Visit: Payer: Self-pay

## 2020-11-26 ENCOUNTER — Ambulatory Visit: Payer: Commercial Managed Care - PPO | Attending: Obstetrics and Gynecology | Admitting: Physical Therapy

## 2020-11-26 ENCOUNTER — Encounter: Payer: Self-pay | Admitting: Physical Therapy

## 2020-11-26 DIAGNOSIS — M6281 Muscle weakness (generalized): Secondary | ICD-10-CM | POA: Insufficient documentation

## 2020-11-26 DIAGNOSIS — R252 Cramp and spasm: Secondary | ICD-10-CM | POA: Diagnosis present

## 2020-11-26 NOTE — Therapy (Signed)
[facility] [address] [place], [place], [address] Phone: [phone]   Fax:  [phone]  Physical Therapy Treatment  Patient Details  Name: Misty Lewis MRN: [id number] Date of Birth: October 30, 1985 Referring Provider (PT): Pryor Ochoa, DO   Encounter Date: 11/26/2020   PT End of Session - 11/26/20 0930    Visit Number 2    Date for PT Re-Evaluation 01/11/21    Authorization Type UHC    PT Start Time [phone]   late   PT Stop Time 0925    PT Time Calculation (min) 33 min    Activity Tolerance Patient tolerated treatment well    Behavior During Therapy [facility] for tasks assessed/performed           Past Medical History:  Diagnosis Date  . Eczema   . Migraine    mirena out 2015 so no longer have ha  . Seasonal allergies     History reviewed. No pertinent surgical history.  There were no vitals filed for this visit.   Subjective Assessment - 11/26/20 0855    Subjective States she is still having pain mostly low back and hips and around into the thighs.  Pt reports 5/10 today    Currently in Pain? Yes    Pain Score 5     Pain Location Back    Pain Descriptors / Indicators Aching    Pain Type Chronic pain    Pain Onset More than a month ago    Pain Frequency Constant    Aggravating Factors  Lt side is bothered when getting up and doing things;    Pain Relieving Factors sitting    Multiple Pain Sites No                             [facility] Adult PT Treatment/Exercise - 11/26/20 0001      Exercises   Exercises Lumbar      Lumbar Exercises: Stretches   Active Hamstring Stretch Right;Left;20 seconds    Single Knee to Chest Stretch Right;Left;2 reps;10 seconds    Quadruped Mid Back Stretch 3 reps;10 seconds    Figure 4 Stretch 1 rep;30 seconds    Other Lumbar Stretch Exercise child pose, diaphragmatic breathing      Manual Therapy   Manual therapy comments bilat glutes STM and  addaday, sacral distraction                  PT Education - 11/26/20 0928    Education Details Access Code: [email or web address]    Person(s) Educated Patient    Methods Explanation;Demonstration;Tactile cues;Verbal cues;Handout    Comprehension Verbalized understanding;Returned demonstration            PT Short Term Goals - 10/19/20 1122      PT SHORT TERM GOAL #1   Title pt will report at least 25% less pain    Time 4    Period Weeks    Status New    Target Date 11/16/20      PT SHORT TERM GOAL #2   Title ind with intial HEP    Time 4    Period Weeks    Status New    Target Date 11/16/20             PT Long Term Goals - 10/19/20 1030      PT LONG TERM GOAL #1   Title Be able to do housework  for at least 2.5 hours without increased pain    Time 12    Period Weeks    Status New    Target Date 01/11/21      PT LONG TERM GOAL #2   Title Pt will be able to workout on pelaton bike for 30 minutes    Baseline unable to use at all    Time 12    Period Weeks    Status New    Target Date 01/11/21      PT LONG TERM GOAL #3   Title Pt will be ind with advanced HEP    Time 12    Period Weeks    Status New    Target Date 01/11/21      PT LONG TERM GOAL #4   Title Pt will report at least 70% reduction in pain overall and understanding of how to manage pain at home for continued improvement    Time 12    Period Weeks    Status New    Target Date 01/11/21                 Plan - 11/26/20 0926    Clinical Impression Statement Pt reports 1/10 pain at the end of treatment.  Gluteal and lumbar STM and stretches were performed today and given intiial HEP.  Pt has a lot of tension throughout and increased lumbar lordosis and core weakness.  Pt will benefit from skilled PT to continue to work on improved soft tissue length and core strength for improved stability.    PT Treatment/Interventions ADLs/Self Care Home Management;Biofeedback;Cryotherapy;Corporate treasurer;Therapeutic activities;Therapeutic exercise;Neuromuscular re-education;Patient/family education;Manual techniques;Taping;Dry needling;Passive range of motion    PT Next Visit Plan f/u on initial HEP, core strength    Consulted and Agree with Plan of Care Patient           Patient will benefit from skilled therapeutic intervention in order to improve the following deficits and impairments:  Abnormal gait,Pain,Postural dysfunction,Impaired flexibility,Increased fascial restricitons,Decreased strength,Decreased coordination,Increased muscle spasms  Visit Diagnosis: Muscle weakness (generalized)  Cramp and spasm     Problem List Patient Active Problem List   Diagnosis Date Noted  . Term pregnancy 06/17/2020  . Chest discomfort 12/06/2018  . Indication for care in labor or delivery 05/01/2017  . SVD (spontaneous vaginal delivery) 05/01/2017    Junious Silk, PT 11/26/2020, 9:30 AM  [facility] [address] [place], [place], [address] Phone: [phone]   Fax:  [phone]  Name: CARLEAH YABLONSKI MRN: [id number] Date of Birth: 1986-04-25

## 2020-11-26 NOTE — Patient Instructions (Signed)
Access Code: [email or web address] URL: [email or web address] Date: 11/26/2020 Prepared by: Dwana Curd  Exercises Diaphragmatic Breathing in Child's Pose with Pelvic Floor Relaxation - 1 x daily - 7 x weekly - 1 sets - 5 reps - 30 sec hold Supine Diaphragmatic Breathing - 3 x daily - 7 x weekly - 1 sets - 10 reps Seated Flexion Stretch - 1 x daily - 7 x weekly - 3 sets - 10 reps Seated Hamstring Stretch - 1 x daily - 7 x weekly - 1 sets - 3 reps - 30 sec hold Seated Figure 4 Piriformis Stretch - 1 x daily - 7 x weekly - 1 sets - 3 reps - 30 hold Cat-Camel to Child's Pose - 1 x daily - 7 x weekly - 5 reps - 1 sets - 10 sec hold

## 2020-12-03 ENCOUNTER — Encounter: Payer: Commercial Managed Care - PPO | Admitting: Physical Therapy

## 2020-12-10 ENCOUNTER — Ambulatory Visit: Payer: Commercial Managed Care - PPO | Admitting: Physical Therapy

## 2020-12-17 ENCOUNTER — Ambulatory Visit: Payer: Commercial Managed Care - PPO | Admitting: Physical Therapy

## 2020-12-17 ENCOUNTER — Encounter: Payer: Self-pay | Admitting: Physical Therapy

## 2020-12-17 ENCOUNTER — Other Ambulatory Visit: Payer: Self-pay

## 2020-12-17 DIAGNOSIS — M6281 Muscle weakness (generalized): Secondary | ICD-10-CM

## 2020-12-17 DIAGNOSIS — R252 Cramp and spasm: Secondary | ICD-10-CM

## 2020-12-17 NOTE — Patient Instructions (Signed)
Access Code: [email or web address] URL: [email or web address] Date: 12/17/2020 Prepared by: Dwana Curd  Exercises Diaphragmatic Breathing in Child's Pose with Pelvic Floor Relaxation - 1 x daily - 7 x weekly - 1 sets - 5 reps - 30 sec hold Supine Diaphragmatic Breathing - 3 x daily - 7 x weekly - 1 sets - 10 reps Seated Flexion Stretch - 1 x daily - 7 x weekly - 3 sets - 10 reps Seated Hamstring Stretch - 1 x daily - 7 x weekly - 1 sets - 3 reps - 30 sec hold Seated Figure 4 Piriformis Stretch - 1 x daily - 7 x weekly - 1 sets - 3 reps - 30 hold Cat-Camel to Child's Pose - 1 x daily - 7 x weekly - 5 reps - 1 sets - 10 sec hold Hooklying Small March - 1 x daily - 7 x weekly - 10 reps - 2 sets Clamshell - 1 x daily - 7 x weekly - 3 sets - 10 reps Quadruped Alternating Arm Lift - 1 x daily - 7 x weekly - 10 reps - 2 sets Shoulder extension with resistance - Neutral - 1 x daily - 7 x weekly - 3 sets - 10 reps

## 2020-12-17 NOTE — Therapy (Signed)
[facility] [address], [address] [place], [place], [address] Phone: [phone]   Fax:  [phone]  Physical Therapy Treatment  Patient Details  Name: KIYLA RINGLER MRN: [id number] Date of Birth: [birth year] Referring Provider (PT): Pryor Ochoa, DO   Encounter Date: 12/17/2020   PT End of Session - 12/17/20 1153    Visit Number 3    Date for PT Re-Evaluation 01/11/21    Authorization Type UHC    PT Start Time 1115   late   PT Stop Time 1142    PT Time Calculation (min) 27 min    Activity Tolerance Patient tolerated treatment well    Behavior During Therapy [facility] for tasks assessed/performed           Past Medical History:  Diagnosis Date  . Eczema   . Migraine    mirena out 2015 so no longer have ha  . Seasonal allergies     History reviewed. No pertinent surgical history.  There were no vitals filed for this visit.   Subjective Assessment - 12/17/20 1116    Subjective Pt states it is better.  I had pain the other day trying to clean the house.    Currently in Pain? No/denies                             [facility] Adult PT Treatment/Exercise - 12/17/20 0001      Lumbar Exercises: Stretches   Figure 4 Stretch 1 rep;30 seconds      Lumbar Exercises: Standing   Shoulder Extension Strengthening;20 reps;Theraband    Theraband Level (Shoulder Extension) Level 1 (Yellow);Level 2 (Red)      Lumbar Exercises: Supine   AB Set Limitations core sets with UEpulses    Large Ball Abdominal Isometric 20 reps    Large Ball Abdominal Isometric Limitations ball overhead and roll out      Lumbar Exercises: Sidelying   Clam Right;Left;10 reps      Lumbar Exercises: Quadruped   Single Arm Raise Right;Left;10 reps    Other Quadruped Lumbar Exercises TrA contraction                  PT Education - 12/17/20 1152    Education Details Access Code: [email or web address]    Methods  Explanation;Demonstration;Tactile cues;Verbal cues;Handout    Comprehension Returned demonstration;Verbalized understanding            PT Short Term Goals - 10/19/20 1122      PT SHORT TERM GOAL #1   Title pt will report at least 25% less pain    Time 4    Period Weeks    Status New    Target Date 11/16/20      PT SHORT TERM GOAL #2   Title ind with intial HEP    Time 4    Period Weeks    Status New    Target Date 11/16/20             PT Long Term Goals - 10/19/20 1030      PT LONG TERM GOAL #1   Title Be able to do housework for at least 2.5 hours without increased pain    Time 12    Period Weeks    Status New    Target Date 01/11/21      PT LONG TERM GOAL #2   Title Pt will be able to workout on pelaton bike  for 30 minutes    Baseline unable to use at all    Time 12    Period Weeks    Status New    Target Date 01/11/21      PT LONG TERM GOAL #3   Title Pt will be ind with advanced HEP    Time 12    Period Weeks    Status New    Target Date 01/11/21      PT LONG TERM GOAL #4   Title Pt will report at least 70% reduction in pain overall and understanding of how to manage pain at home for continued improvement    Time 12    Period Weeks    Status New    Target Date 01/11/21                 Plan - 12/17/20 1153    Clinical Impression Statement Pt has been doing much better and has no pain today.  Pt was able to work on core strength today at basic level. She will benefit from skilled PT to address core strength for functional activities such as lifting children and housework as these activities still cause significant pain.    PT Treatment/Interventions ADLs/Self Care Home Management;Biofeedback;Cryotherapy;Academic librarian;Therapeutic activities;Therapeutic exercise;Neuromuscular re-education;Patient/family education;Manual techniques;Taping;Dry needling;Passive range of motion    PT Next Visit Plan f/u on new HEP;  bending and lifting with core engaged    PT Home Exercise Plan Access Code: [email or web address]    Consulted and Agree with Plan of Care Patient           Patient will benefit from skilled therapeutic intervention in order to improve the following deficits and impairments:  Abnormal gait,Pain,Postural dysfunction,Impaired flexibility,Increased fascial restricitons,Decreased strength,Decreased coordination,Increased muscle spasms  Visit Diagnosis: Muscle weakness (generalized)  Cramp and spasm     Problem List Patient Active Problem List   Diagnosis Date Noted  . Term pregnancy 06/17/2020  . Chest discomfort 12/06/2018  . Indication for care in labor or delivery 05/01/2017  . SVD (spontaneous vaginal delivery) 05/01/2017    Junious Silk, PT 12/17/2020, 11:55 AM  [facility] [address], [address] [place], [place], [address] Phone: [phone]   Fax:  [phone]  Name: KAMILLAH DIDONATO MRN: [id number] Date of Birth: 07-28-1986

## 2020-12-24 ENCOUNTER — Other Ambulatory Visit: Payer: Self-pay

## 2020-12-24 ENCOUNTER — Ambulatory Visit: Payer: Commercial Managed Care - PPO | Attending: Obstetrics and Gynecology | Admitting: Physical Therapy

## 2020-12-24 ENCOUNTER — Encounter: Payer: Self-pay | Admitting: Physical Therapy

## 2020-12-24 DIAGNOSIS — M6281 Muscle weakness (generalized): Secondary | ICD-10-CM | POA: Insufficient documentation

## 2020-12-24 DIAGNOSIS — R252 Cramp and spasm: Secondary | ICD-10-CM | POA: Insufficient documentation

## 2020-12-24 NOTE — Therapy (Signed)
[facility] [address], [place], [place], [address] Phone: [phone]   Fax:  [phone]  Physical Therapy Treatment  Patient Details  Name: Misty Lewis MRN: [id number] Date of Birth: 12-Jul-1986 Referring Provider (PT): Carlynn Purl, [place]   Encounter Date: 12/24/2020   PT End of Session - 12/24/20 1111    Visit Number 4    Date for PT Re-Evaluation 01/11/21    Authorization Type UHC    PT Start Time 1106    PT Stop Time 1145    PT Time Calculation (min) 39 min    Activity Tolerance Patient tolerated treatment well    Behavior During Therapy [facility] for tasks assessed/performed           Past Medical History:  Diagnosis Date  . Eczema   . Migraine    mirena out 2015 so no longer have ha  . Seasonal allergies     History reviewed. No pertinent surgical history.  There were no vitals filed for this visit.   Subjective Assessment - 12/24/20 1107    Subjective No pain today.  Pt states she was in the office Wednesday and hit a corner of a desk that is sore on her side today.    Currently in Pain? No/denies                             [facility] Adult PT Treatment/Exercise - 12/24/20 0001      Lumbar Exercises: Stretches   Active Hamstring Stretch Right;Left;20 seconds    Hip Flexor Stretch Right;Left;2 reps    Standing Side Bend Right;Left;2 reps      Lumbar Exercises: Aerobic   Elliptical 2/2 fwd and back - status updated      Lumbar Exercises: Standing   Other Standing Lumbar Exercises walk backward holding row with 25#    Other Standing Lumbar Exercises half kneel stretch and chop yellow ball      Lumbar Exercises: Sidelying   Clam Right;Left;10 reps      Lumbar Exercises: Prone   Straight Leg Raise 10 reps   pillows under pelvis                 PT Education - 12/24/20 1155    Education Details Access Code: [email or web address]    Person(s) Educated Patient    Methods  Explanation;Demonstration;Verbal cues;Tactile cues;Handout    Comprehension Verbalized understanding;Returned demonstration            PT Short Term Goals - 12/24/20 1112      PT SHORT TERM GOAL #1   Title pt will report at least 25% less pain    Baseline 60% less    Status Achieved      PT SHORT TERM GOAL #2   Title ind with intial HEP    Status Achieved             PT Long Term Goals - 12/24/20 1112      PT LONG TERM GOAL #1   Title Be able to do housework for at least 2.5 hours without increased pain    Baseline can do an hour without pain    Status On-going      PT LONG TERM GOAL #2   Title Pt will be able to workout on pelaton bike for 30 minutes    Baseline Lt leg locks up    Status On-going      PT  LONG TERM GOAL #3   Title Pt will be ind with advanced HEP    Status On-going      PT LONG TERM GOAL #4   Title Pt will report at least 70% reduction in pain overall and understanding of how to manage pain at home for continued improvement    Baseline 60%    Status Partially Met                 Plan - 12/24/20 1137    Clinical Impression Statement Pt demonstrates quad and hip flexor tension when walking back with sports cord.  Pt does well with cues to keep from rotating pelvis during exercises.  pt needed a lot of cues to activate glute med as she tends to use TFL a lot.  Pt will benefit from skilled PT to continue to address muscle imbalances and posture for improved function without pain.    PT Treatment/Interventions ADLs/Self Care Home Management;Biofeedback;Cryotherapy;Software engineer;Therapeutic activities;Therapeutic exercise;Neuromuscular re-education;Patient/family education;Manual techniques;Taping;Dry needling;Passive range of motion    PT Next Visit Plan f/u on hip flexor stretch and half kneel    PT Home Exercise Plan Access Code: [email or web address]    Consulted and Agree with Plan of Care Patient           Patient  will benefit from skilled therapeutic intervention in order to improve the following deficits and impairments:  Abnormal gait,Pain,Postural dysfunction,Impaired flexibility,Increased fascial restricitons,Decreased strength,Decreased coordination,Increased muscle spasms  Visit Diagnosis: Muscle weakness (generalized)  Cramp and spasm     Problem List Patient Active Problem List   Diagnosis Date Noted  . Term pregnancy 06/17/2020  . Chest discomfort 12/06/2018  . Indication for care in labor or delivery 05/01/2017  . SVD (spontaneous vaginal delivery) 05/01/2017    Jule Ser, PT 12/24/2020, 11:56 AM  [facility] [address] [place], [place], [address] Phone: [phone]   Fax:  [phone]  Name: Misty Lewis MRN: [id number] Date of Birth: 1986-03-30

## 2020-12-24 NOTE — Patient Instructions (Signed)
Access Code: [email or web address] URL: [email or web address] Date: 12/24/2020 Prepared by: Dwana Curd  Exercises Diaphragmatic Breathing in Child's Pose with Pelvic Floor Relaxation - 1 x daily - 7 x weekly - 1 sets - 5 reps - 30 sec hold Supine Diaphragmatic Breathing - 3 x daily - 7 x weekly - 1 sets - 10 reps Seated Flexion Stretch - 1 x daily - 7 x weekly - 3 sets - 10 reps Seated Hamstring Stretch - 1 x daily - 7 x weekly - 1 sets - 3 reps - 30 sec hold Seated Figure 4 Piriformis Stretch - 1 x daily - 7 x weekly - 1 sets - 3 reps - 30 hold Cat-Camel to Child's Pose - 1 x daily - 7 x weekly - 5 reps - 1 sets - 10 sec hold Hooklying Small March - 1 x daily - 7 x weekly - 10 reps - 2 sets Clamshell - 1 x daily - 7 x weekly - 3 sets - 10 reps Quadruped Alternating Arm Lift - 1 x daily - 7 x weekly - 10 reps - 2 sets Shoulder extension with resistance - Neutral - 1 x daily - 7 x weekly - 3 sets - 10 reps Prone Quadriceps Stretch with Strap - 1 x daily - 7 x weekly - 3 reps - 1 sets - 30sec hold Half Kneeling Hip Flexor Stretch with Tri-Planar Reach - 1 x daily - 7 x weekly - 3 sets - 10 reps Half Kneeling Chop with Medicine Ball - 1 x daily - 7 x weekly - 3 sets - 10 reps Standing ITB Stretch - 1 x daily - 7 x weekly - 3 reps - 1 sets - 30 sec hold

## 2020-12-31 ENCOUNTER — Ambulatory Visit: Payer: Commercial Managed Care - PPO | Admitting: Physical Therapy

## 2020-12-31 ENCOUNTER — Encounter: Payer: Self-pay | Admitting: Physical Therapy

## 2020-12-31 DIAGNOSIS — M6281 Muscle weakness (generalized): Secondary | ICD-10-CM | POA: Diagnosis not present

## 2020-12-31 DIAGNOSIS — R252 Cramp and spasm: Secondary | ICD-10-CM

## 2020-12-31 NOTE — Patient Instructions (Signed)
Access Code: [email or web address] URL: [email or web address] Date: 12/31/2020 Prepared by: Dwana Curd  Exercises Diaphragmatic Breathing in Child's Pose with Pelvic Floor Relaxation - 1 x daily - 7 x weekly - 1 sets - 5 reps - 30 sec hold Supine Diaphragmatic Breathing - 3 x daily - 7 x weekly - 1 sets - 10 reps Seated Flexion Stretch - 1 x daily - 7 x weekly - 3 sets - 10 reps Seated Hamstring Stretch - 1 x daily - 7 x weekly - 1 sets - 3 reps - 30 sec hold Seated Figure 4 Piriformis Stretch - 1 x daily - 7 x weekly - 1 sets - 3 reps - 30 hold Cat-Camel to Child's Pose - 1 x daily - 7 x weekly - 5 reps - 1 sets - 10 sec hold Hooklying Small March - 1 x daily - 7 x weekly - 10 reps - 2 sets Quadruped Alternating Arm Lift - 1 x daily - 7 x weekly - 10 reps - 2 sets Shoulder extension with resistance - Neutral - 1 x daily - 7 x weekly - 3 sets - 10 reps Prone Quadriceps Stretch with Strap - 1 x daily - 7 x weekly - 3 reps - 1 sets - 30sec hold Clamshell - 1 x daily - 7 x weekly - 3 sets - 10 reps Standing ITB Stretch - 1 x daily - 7 x weekly - 3 reps - 1 sets - 30 sec hold Standing Hip Flexor Stretch - 3 x daily - 7 x weekly - 2 reps - 1 sets - 30 sec hold Standing Hip Abduction - 1 x daily - 7 x weekly - 3 sets - 10 reps Seated Hamstring Stretch on Swiss Ball - 1 x daily - 7 x weekly - 3 reps - 1 sets - 30 sec hold Seated Diagonal Chop with Medicine Ball - 1 x daily - 7 x weekly - 3 sets - 10 reps

## 2020-12-31 NOTE — Therapy (Signed)
[facility] [address] [place], [place], [address] Phone: [phone]   Fax:  [phone]  Physical Therapy Treatment  Patient Details  Name: Misty Lewis MRN: [id number] Date of Birth: 1986-06-19 Referring Provider (PT): Pryor Ochoa, [place]   Encounter Date: 12/31/2020   PT End of Session - 12/31/20 1117    Visit Number 5    Date for PT Re-Evaluation 01/11/21    Authorization Type UHC    PT Start Time 1113   arrived late   PT Stop Time 1145    PT Time Calculation (min) 32 min    Activity Tolerance Patient tolerated treatment well    Behavior During Therapy [facility] for tasks assessed/performed           Past Medical History:  Diagnosis Date  . Eczema   . Migraine    mirena out 2015 so no longer have ha  . Seasonal allergies     History reviewed. No pertinent surgical history.  There were no vitals filed for this visit.   Subjective Assessment - 12/31/20 1115    Subjective Pt states she had pain yesterday because she was trying to do a lot of things at once.  Pain a little today    Currently in Pain? Yes    Pain Score 3     Pain Location Leg    Pain Orientation Right    Pain Descriptors / Indicators Aching    Pain Radiating Towards back of the leg and calf    Pain Onset More than a month ago    Pain Frequency Intermittent    Multiple Pain Sites No                             [facility] Adult PT Treatment/Exercise - 12/31/20 0001      Lumbar Exercises: Stretches   Other Lumbar Stretch Exercise hamsting and hip flexor sitting on ball    Other Lumbar Stretch Exercise pelvic tilt on ball      Lumbar Exercises: Aerobic   UBE (Upper Arm Bike) 2/2 fwd/back with cues for posture      Lumbar Exercises: Standing   Lifting Limitations hip abduction 10x each - cues for neutral pelvis      Lumbar Exercises: Seated   Other Seated Lumbar Exercises lifting weighted ball sitting on pball;  chops sitting on pball - 10x                  PT Education - 12/31/20 1150    Education Details Access Code: [email or web address]    Person(s) Educated Patient    Methods Explanation;Demonstration;Tactile cues;Verbal cues;Handout    Comprehension Verbalized understanding;Returned demonstration            PT Short Term Goals - 12/24/20 1112      PT SHORT TERM GOAL #1   Title pt will report at least 25% less pain    Baseline 60% less    Status Achieved      PT SHORT TERM GOAL #2   Title ind with intial HEP    Status Achieved             PT Long Term Goals - 12/31/20 1134      PT LONG TERM GOAL #1   Title Be able to do housework for at least 2.5 hours without increased pain    Time 8    Period Weeks  Status On-going    Target Date 02/25/21      PT LONG TERM GOAL #2   Title Pt will be able to workout on pelaton bike for 30 minutes    Time 8    Period Weeks    Status On-going    Target Date 02/25/21      PT LONG TERM GOAL #3   Title Pt will be ind with advanced HEP    Time 8    Period Weeks    Status On-going    Target Date 02/25/21      PT LONG TERM GOAL #4   Title Pt will report at least 70% reduction in pain overall and understanding of how to manage pain at home for continued improvement    Baseline 60%    Time 8    Period Weeks    Status On-going    Target Date 02/25/21                 Plan - 12/31/20 1151    Clinical Impression Statement Pt is making good progress with stretches.  She is challenged with the glute strengthening and still feels like clamshells are hard.  Pt was not having any pain in left leg after doing a lot yesterday.  Pain in Rt leg today sounded more like muscle tension in the hamstrings.  Pt has not attempted using the pelaton yet.  She is still limited with house hold chores and has limited hip ROM . Pt will benefit from extension of plan of care to continue with skilled PT as she is expected to make further progress at this  time.  She missed several appointments at the beginning due to work and scheduling issues.    Personal Factors and Comorbidities Comorbidity 2;Time since onset of injury/illness/exacerbation    Comorbidities 2nd deg tear x 2; vaginal delivery x 2    Examination-Activity Limitations Stand    Examination-Participation Restrictions Community Activity;Cleaning    Rehab Potential Excellent    PT Frequency 1x / week    PT Duration 8 weeks    PT Treatment/Interventions ADLs/Self Care Home Management;Biofeedback;Cryotherapy;Academic librarian;Therapeutic activities;Therapeutic exercise;Neuromuscular re-education;Patient/family education;Manual techniques;Taping;Dry needling;Passive range of motion    PT Next Visit Plan f/u hip abduction, glute strength progress as able, used pelaton?    PT Home Exercise Plan Access Code: [email or web address]    Consulted and Agree with Plan of Care Patient           Patient will benefit from skilled therapeutic intervention in order to improve the following deficits and impairments:  Abnormal gait,Pain,Postural dysfunction,Impaired flexibility,Increased fascial restricitons,Decreased strength,Decreased coordination,Increased muscle spasms  Visit Diagnosis: Muscle weakness (generalized)  Cramp and spasm     Problem List Patient Active Problem List   Diagnosis Date Noted  . Term pregnancy 06/17/2020  . Chest discomfort 12/06/2018  . Indication for care in labor or delivery 05/01/2017  . SVD (spontaneous vaginal delivery) 05/01/2017    Junious Silk, PT 12/31/2020, 12:17 PM  [facility] [address], [address] [place], [place], [address] Phone: [phone]   Fax:  [phone]  Name: Misty Lewis MRN: [id number] Date of Birth: 1986/01/06

## 2021-01-14 ENCOUNTER — Encounter: Payer: Commercial Managed Care - PPO | Admitting: Physical Therapy

## 2021-02-01 ENCOUNTER — Other Ambulatory Visit: Payer: Self-pay

## 2021-02-01 ENCOUNTER — Telehealth (INDEPENDENT_AMBULATORY_CARE_PROVIDER_SITE_OTHER): Payer: Commercial Managed Care - PPO | Admitting: Nurse Practitioner

## 2021-02-01 ENCOUNTER — Encounter: Payer: Self-pay | Admitting: Nurse Practitioner

## 2021-02-01 VITALS — Ht 64.0 in | Wt 230.0 lb

## 2021-02-01 DIAGNOSIS — R0981 Nasal congestion: Secondary | ICD-10-CM

## 2021-02-01 DIAGNOSIS — J3489 Other specified disorders of nose and nasal sinuses: Secondary | ICD-10-CM | POA: Diagnosis not present

## 2021-02-01 MED ORDER — AMOXICILLIN-POT CLAVULANATE 875-125 MG PO TABS: 1.0000 | ORAL_TABLET | Freq: Two times a day (BID) | ORAL | 0 refills | Status: AC

## 2021-02-01 NOTE — Progress Notes (Addendum)
This visit type was conducted due to national recommendations for restrictions regarding the COVID-19 Pandemic (e.g. social distancing) in an effort to limit this patient's exposure and mitigate transmission in our community.  Due to her co-morbid illnesses, this patient is at least at moderate risk for complications without adequate follow up.  This format is felt to be most appropriate for this patient at this time.  All issues noted in this document were discussed and addressed.  A limited physical exam was performed with this format.    This visit type was conducted due to national recommendations for restrictions regarding the COVID-19 Pandemic (e.g. social distancing) in an effort to limit this patient's exposure and mitigate transmission in our community.  Patients identity confirmed using two different identifiers.  This format is felt to be most appropriate for this patient at this time.  All issues noted in this document were discussed and addressed.  No physical exam was performed (except for noted visual exam findings with Video Visits).  This visit occurred during the SARS-CoV-2 public health emergency.  Safety protocols were in place, including screening questions prior to the visit, additional usage of staff PPE, and extensive cleaning of exam room while observing appropriate contact time as indicated for disinfecting solutions.  Patient Location:  Home - spoke with Misty Lewis  Provider location:  Office  Subjective:    Patient ID: Misty Lewis , female    DOB: 1985-12-20 , 35 y.o.   MRN: [id number]     Chief Complaint  Patient presents with   Sinus Problem    Patient stated she was having a lot of sinus pressure yesterday. She also took a covid test and Sunday and yesterday and both were negative. Last night she had some chills and night sweats, cough and runny nose.     HPI  Virtual visit due to sinus pressure and symptoms. She is having sinus pressure around nose,  eyes and cheeks. Feel like someone hit her like a ton of bricks, last night also has chills. Symptoms started Saturday evening, had a slight sore throat Thursday and improved.  She has been drinking hot tea and honey.   Sinus Problem This is a new problem. The current episode started in the past 7 days (Sunday). There has been no fever. Associated symptoms include chills, congestion and sinus pressure. Pertinent negatives include no ear pain, headaches, shortness of breath or sneezing. Past treatments include acetaminophen and spray decongestants (mucinex yesterday morning - helped with congestion). The treatment provided mild relief.     Past Medical History:  Diagnosis Date   Eczema    Migraine    mirena out 2015 so no longer have ha   Seasonal allergies      Family History  Problem Relation Age of Onset   Diabetes Mother    Hypertension Mother    Lupus Maternal Aunt    Lymphoma Maternal Aunt    Diabetes Maternal Aunt    Hyperlipidemia Maternal Aunt      Current Outpatient Medications:    amoxicillin-clavulanate (AUGMENTIN) 875-125 MG tablet, Take 1 tablet by mouth 2 (two) times daily for 10 days., Disp: 20 tablet, Rfl: 0   Prenatal Vit-Fe Fumarate-FA (PRENATAL MULTIVITAMIN) TABS tablet, Take 1 tablet by mouth daily at 12 noon. , Disp: , Rfl:    fluticasone (FLONASE) 50 MCG/ACT nasal spray, Place 2 sprays into both nostrils daily., Disp: 18.2 g, Rfl: 2   Allergies  Allergen Reactions   Coconut Flavor Anaphylaxis and  Nausea And Vomiting   Mushroom Extract Complex Anaphylaxis and Nausea And Vomiting    mushrooms   Cherry Nausea And Vomiting   Mustard Seed Nausea And Vomiting    Condiment- Mustard    Other Hives    olives     Review of Systems  Constitutional: Positive for chills.  HENT: Positive for congestion and sinus pressure. Negative for ear pain and sneezing.   Respiratory: Negative for apnea, chest tightness, shortness of breath and wheezing.   Cardiovascular:  Negative for chest pain, palpitations and leg swelling.  Musculoskeletal: Negative for arthralgias and back pain.  Neurological: Negative for dizziness and headaches.  Psychiatric/Behavioral: Negative.      Today's Vitals   02/01/21 1356  Weight: 230 lb (104.3 kg)  Height: 5\' 4"  (1.626 m)  PainSc: 6   PainLoc: Face   Body mass index is 39.48 kg/m.   Objective:  Physical Exam Vitals reviewed.  Constitutional:      General: She is not in acute distress.    Appearance: Normal appearance.  Cardiovascular:     Pulses: Normal pulses.     Heart sounds: Normal heart sounds. No murmur heard.   Pulmonary:     Effort: Pulmonary effort is normal. No respiratory distress.     Breath sounds: No wheezing.     Comments: Dry cough noted during virtual visit.  Neurological:     General: No focal deficit present.     Mental Status: She is alert and oriented to person, place, and time.     Cranial Nerves: No cranial nerve deficit.     Motor: No weakness.  Psychiatric:        Mood and Affect: Mood normal.        Behavior: Behavior normal.        Thought Content: Thought content normal.        Judgment: Judgment normal.         Assessment And Plan:     1. Sinus pressure She is to come to office for a PCR covid test Will treat for sinus infection She is aware to remain in isolation until has results - Novel Coronavirus, NAA ([facility]); Future - amoxicillin-clavulanate (AUGMENTIN) 875-125 MG tablet; Take 1 tablet by mouth 2 (two) times daily for 10 days.  Dispense: 20 tablet; Refill: 0  2. Nasal congestion - Novel Coronavirus, NAA ([facility]); Future - amoxicillin-clavulanate (AUGMENTIN) 875-125 MG tablet; Take 1 tablet by mouth 2 (two) times daily for 10 days.  Dispense: 20 tablet; Refill: 0   Patient Risk:   After full review of this patients clinical status, I feel that they are at least moderate risk at this time.  Time:   Today, I have spent 11 minutes/ seconds with the  patient with telehealth technology discussing above diagnoses.   Patient was given opportunity to ask questions. Patient verbalized understanding of the plan and was able to repeat key elements of the plan. All questions were answered to their satisfaction.  , FNP   I, Arnette Felts, FNP, have reviewed all documentation for this visit. The documentation on 02/01/21 for the exam, diagnosis, procedures, and orders are all accurate and complete.      IF YOU HAVE BEEN REFERRED TO A SPECIALIST, IT MAY TAKE 1-2 WEEKS TO SCHEDULE/PROCESS THE REFERRAL. IF YOU HAVE NOT HEARD FROM US/SPECIALIST IN TWO WEEKS, PLEASE GIVE 04/03/21 A CALL AT [phone] X [phone].   THE PATIENT IS ENCOURAGED TO PRACTICE SOCIAL DISTANCING DUE TO THE COVID-19 PANDEMIC.

## 2021-02-03 ENCOUNTER — Other Ambulatory Visit: Payer: Self-pay

## 2021-02-03 ENCOUNTER — Other Ambulatory Visit: Payer: Commercial Managed Care - PPO

## 2021-02-03 DIAGNOSIS — R0981 Nasal congestion: Secondary | ICD-10-CM

## 2021-02-03 DIAGNOSIS — J3489 Other specified disorders of nose and nasal sinuses: Secondary | ICD-10-CM

## 2021-02-04 LAB — SARS-COV-2, NAA 2 DAY TAT

## 2021-02-04 LAB — NOVEL CORONAVIRUS, NAA: SARS-CoV-2, NAA: NOT DETECTED

## 2021-02-18 ENCOUNTER — Ambulatory Visit: Payer: Commercial Managed Care - PPO | Attending: Obstetrics and Gynecology | Admitting: Physical Therapy

## 2021-02-18 ENCOUNTER — Encounter: Payer: Self-pay | Admitting: Physical Therapy

## 2021-02-18 ENCOUNTER — Other Ambulatory Visit: Payer: Self-pay

## 2021-02-18 DIAGNOSIS — M6281 Muscle weakness (generalized): Secondary | ICD-10-CM | POA: Diagnosis not present

## 2021-02-18 DIAGNOSIS — R252 Cramp and spasm: Secondary | ICD-10-CM | POA: Diagnosis present

## 2021-02-18 NOTE — Therapy (Signed)
[facility] [address] [place], [place], [address] Phone: [phone]   Fax:  [phone]  Physical Therapy Treatment  Patient Details  Name: Misty Lewis MRN: [id number] Date of Birth: 1986-06-02 Referring Provider (PT): Pryor Ochoa, [place]   Encounter Date: 02/18/2021   PT End of Session - 02/18/21 0901    Visit Number 6    Date for PT Re-Evaluation 04/15/21    Authorization Type UHC    PT Start Time 0901   arrived late   PT Stop Time 0928    PT Time Calculation (min) 27 min    Activity Tolerance Patient tolerated treatment well    Behavior During Therapy [facility] for tasks assessed/performed           Past Medical History:  Diagnosis Date  . Eczema   . Migraine    mirena out 2015 so no longer have ha  . Seasonal allergies     History reviewed. No pertinent surgical history.  There were no vitals filed for this visit.   Subjective Assessment - 02/18/21 0903    Subjective Pt states she is back to yoga and walking.  Still having tightness on the Lt leg.    How long can you stand comfortably? 1.5 hours    Currently in Pain? Yes    Pain Score 5    at worst   Pain Location Hip    Pain Orientation Left    Pain Descriptors / Indicators Tightness    Pain Type Chronic pain    Pain Radiating Towards hamstrings not past knee    Pain Onset More than a month ago    Pain Frequency Intermittent    Aggravating Factors  standing and housework    Pain Relieving Factors sitting and lunges    Multiple Pain Sites No              [facility] PT Assessment - 02/18/21 0001      Assessment   Medical Diagnosis M86.8X8 (ICD-10-CM) - Other osteomyelitis, other site    Referring Provider (PT) Pryor Ochoa, DO    Onset Date/Surgical Date --   September 2021   Prior Therapy No      Precautions   Precautions None      Restrictions   Weight Bearing Restrictions No      Home Environment   Living Environment Private  residence    Living Arrangements Spouse/significant other;Children   75 yo; 31 month old     Prior Function   Level of Independence Independent    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 so she is up and down throughout the day    Leisure Higher education careers adviser   Overall Cognitive Status Within Functional Limits for tasks assessed      Functional Tests   Functional tests --      Single Leg Stance   Comments --      Posture/Postural Control   Posture/Postural Control Postural limitations    Postural Limitations Forward head;Anterior pelvic tilt;Increased lumbar lordosis   Rt ilium superior to Lt side; Rt trunk slight sidebend   Posture Comments hip flexors tight      AROM   Overall AROM Comments fwd flexion lumbar 70%      Strength   Overall Strength Comments LE 5/5; core weakness transverse abs and lumbar multifidi  Flexibility   Soft Tissue Assessment /Muscle Length yes    Hamstrings 70%      Palpation   Palpation comment lumbar paraspinals tight; pubic symphasis TTP;      Special Tests   Other special tests ASLR with improvement lumbar multifidi support      Ambulation/Gait   Gait Pattern --                         [facility] Adult PT Treatment/Exercise - 02/18/21 0001      Lumbar Exercises: Standing   Wall Slides 15 reps    Wall Slides Limitations VC and TC to tilt before sliding; holding kettle bell 5lb    Other Standing Lumbar Exercises hip hinge with cues on low back and bottom to wall      Lumbar Exercises: Prone   Straight Leg Raise 10 reps   on 2 pillow; multifidi activation     Lumbar Exercises: Quadruped   Straight Leg Raise 5 reps                    PT Short Term Goals - 12/24/20 1112      PT SHORT TERM GOAL #1   Title pt will report at least 25% less pain    Baseline 60% less    Status Achieved      PT SHORT TERM GOAL #2   Title ind with  intial HEP    Status Achieved             PT Long Term Goals - 02/18/21 1039      PT LONG TERM GOAL #1   Title Be able to do housework for at least 2.5 hours without increased pain    Time 8    Period Weeks    Status On-going    Target Date 04/15/21      PT LONG TERM GOAL #2   Title Pt will be able to workout on pelaton bike for 30 minutes    Time 8    Period Weeks    Status On-going    Target Date 04/15/21      PT LONG TERM GOAL #3   Title Pt will be ind with advanced HEP    Time 8    Period Weeks    Status On-going    Target Date 04/15/21      PT LONG TERM GOAL #4   Title Pt will report at least 70% reduction in pain overall and understanding of how to manage pain at home for continued improvement    Baseline 60%    Time 8    Period Weeks    Status On-going    Target Date 04/15/21                 Plan - 02/18/21 1036    Clinical Impression Statement Pt did not return since previous assessment.  She is doing a little better but still demonstrating weakness and having less symptoms down leg now only to knee.  Pt is making good progress with stretches. She is challenged with the glute strengthening and still feels like clamshells are getting better. Pt has not attempted using the pelaton yet but is walking more. She is still limited with house hold chores and has limited hip ROM . Pt will benefit from extension of plan of care to continue with skilled PT as she is expected to make further progress at this time. She missed  several appointments at the beginning due to work and scheduling issues.    Personal Factors and Comorbidities Comorbidity 2;Time since onset of injury/illness/exacerbation    Stability/Clinical Decision Making Evolving/Moderate complexity    Rehab Potential Excellent    PT Frequency 1x / week    PT Duration 8 weeks    PT Treatment/Interventions ADLs/Self Care Home Management;Biofeedback;Cryotherapy;Secretary/administrator;Therapeutic activities;Therapeutic exercise;Neuromuscular re-education;Patient/family education;Manual techniques;Taping;Dry needling;Passive range of motion    PT Next Visit Plan f/u hip abduction, glute strength progress, lumbar multifidi; ball UE/LE reaches; hip hinge progressions    PT Home Exercise Plan Access Code: [email or web address]    Consulted and Agree with Plan of Care Patient           Patient will benefit from skilled therapeutic intervention in order to improve the following deficits and impairments:  Abnormal gait,Pain,Postural dysfunction,Impaired flexibility,Increased fascial restricitons,Decreased strength,Decreased coordination,Increased muscle spasms  Visit Diagnosis: Muscle weakness (generalized)  Cramp and spasm     Problem List Patient Active Problem List   Diagnosis Date Noted  . Term pregnancy 06/17/2020  . Chest discomfort 12/06/2018  . Indication for care in labor or delivery 05/01/2017  . SVD (spontaneous vaginal delivery) 05/01/2017    Junious Silk, PT 02/18/2021, 10:41 AM  [facility] [address] [place], [place], [address] Phone: [phone]   Fax:  [phone]  Name: Misty Lewis MRN: [id number] Date of Birth: [birth year]

## 2021-02-25 ENCOUNTER — Encounter: Payer: Commercial Managed Care - PPO | Admitting: Physical Therapy

## 2021-03-04 ENCOUNTER — Ambulatory Visit: Payer: Commercial Managed Care - PPO | Attending: Obstetrics and Gynecology | Admitting: Physical Therapy

## 2021-03-04 ENCOUNTER — Other Ambulatory Visit: Payer: Self-pay

## 2021-03-04 ENCOUNTER — Encounter: Payer: Self-pay | Admitting: Physical Therapy

## 2021-03-04 DIAGNOSIS — R252 Cramp and spasm: Secondary | ICD-10-CM | POA: Insufficient documentation

## 2021-03-04 DIAGNOSIS — M6281 Muscle weakness (generalized): Secondary | ICD-10-CM | POA: Diagnosis present

## 2021-03-04 NOTE — Therapy (Signed)
[facility] [address] [place], [place], [address] Phone: [phone]   Fax:  [phone]  Physical Therapy Treatment  Patient Details  Name: Misty Lewis MRN: [id number] Date of Birth: 1986/05/02 Referring Provider (PT): Carlynn Purl, DO   Encounter Date: 03/04/2021   PT End of Session - 03/04/21 0904     Visit Number 7    Date for PT Re-Evaluation 04/15/21    Authorization Type UHC    PT Start Time 0900   late   PT Stop Time 0925    PT Time Calculation (min) 25 min    Activity Tolerance Patient tolerated treatment well    Behavior During Therapy [facility] for tasks assessed/performed             Past Medical History:  Diagnosis Date   Eczema    Migraine    mirena out 2015 so no longer have ha   Seasonal allergies     History reviewed. No pertinent surgical history.  There were no vitals filed for this visit.   Subjective Assessment - 03/04/21 0902     Subjective Pt denies pain. States she was running around a lot this weekend and got sore, but no pain. Feel a little tight left leg today    How long can you stand comfortably? standing as long as I want    Currently in Pain? No/denies                               [facility] Adult PT Treatment/Exercise - 03/04/21 0001       Neuro Re-ed    Neuro Re-ed Details  VC for nuetral spine and correct alignment to hip during ex's      Lumbar Exercises: Standing   Lifting Limitations 10 lb dead lift - 20x      Lumbar Exercises: Seated   Other Seated Lumbar Exercises prone on pball W, T    Other Seated Lumbar Exercises seated on pball 1plate diagonal      Lumbar Exercises: Sidelying   Hip Abduction 15 reps;Right;Left      Lumbar Exercises: Quadruped   Single Arm Raise 10 reps    Straight Leg Raise 10 reps    Other Quadruped Lumbar Exercises above done on pball                      PT Short Term Goals - 12/24/20  1112       PT SHORT TERM GOAL #1   Title pt will report at least 25% less pain    Baseline 60% less    Status Achieved      PT SHORT TERM GOAL #2   Title ind with intial HEP    Status Achieved               PT Long Term Goals - 03/04/21 0910       PT LONG TERM GOAL #1   Title Be able to do housework for at least 2.5 hours without increased pain    Baseline walking and chores all day Saturday with no pain    Status Partially Met      PT LONG TERM GOAL #2   Title Pt will be able to workout on pelaton bike for 30 minutes    Baseline hasn't done this    Status On-going      PT LONG TERM GOAL #  3   Title Pt will be ind with advanced HEP    Status On-going      PT LONG TERM GOAL #4   Title Pt will report at least 70% reduction in pain overall and understanding of how to manage pain at home for continued improvement    Status On-going                   Plan - 03/04/21 0911     Clinical Impression Statement Pt continues to demonstrate progress as she was able to stand and walk a lot without increased pain.  Pt did well with hip hinge and deadlift today.  She expressed that the UE/LE reaches on ball were challenging and she needed TC to keep spine neutral.  Pt will benefit from skilled PT to continue working on independence with HEP and improved body awareness    PT Treatment/Interventions ADLs/Self Care Home Management;Biofeedback;Cryotherapy;Software engineer;Therapeutic activities;Therapeutic exercise;Neuromuscular re-education;Patient/family education;Manual techniques;Taping;Dry needling;Passive range of motion    PT Next Visit Plan core and hip strength and stability; body awareness to reduce upper and lower crossing    PT Home Exercise Plan Access Code: [email or web address]    Consulted and Agree with Plan of Care Patient             Patient will benefit from skilled therapeutic intervention in order to improve the following deficits and  impairments:  Abnormal gait, Pain, Postural dysfunction, Impaired flexibility, Increased fascial restricitons, Decreased strength, Decreased coordination, Increased muscle spasms  Visit Diagnosis: Muscle weakness (generalized)  Cramp and spasm     Problem List Patient Active Problem List   Diagnosis Date Noted   Term pregnancy 06/17/2020   Chest discomfort 12/06/2018   Indication for care in labor or delivery 05/01/2017   SVD (spontaneous vaginal delivery) 05/01/2017    Jule Ser, OT 03/04/2021, 9:43 AM  [facility] [address] [place], [place], [address] Phone: [phone]   Fax:  [phone]  Name: Misty Lewis MRN: [id number] Date of Birth: 1985/10/25

## 2021-03-11 ENCOUNTER — Ambulatory Visit: Payer: Commercial Managed Care - PPO | Admitting: Physical Therapy

## 2021-03-11 ENCOUNTER — Encounter: Payer: Self-pay | Admitting: Physical Therapy

## 2021-03-11 ENCOUNTER — Other Ambulatory Visit: Payer: Self-pay

## 2021-03-11 DIAGNOSIS — R252 Cramp and spasm: Secondary | ICD-10-CM

## 2021-03-11 DIAGNOSIS — M6281 Muscle weakness (generalized): Secondary | ICD-10-CM

## 2021-03-11 NOTE — Therapy (Signed)
[facility] [address], [place], [place], [address] Phone: [phone]   Fax:  [phone]  Physical Therapy Treatment  Patient Details  Name: Misty Lewis MRN: [id number] Date of Birth: 27-Feb-1986 Referring Provider (PT): Carlynn Purl, [place]   Encounter Date: 03/11/2021   PT End of Session - 03/11/21 0849     Visit Number 8    Date for PT Re-Evaluation 04/15/21    Authorization Type UHC    PT Start Time 0849    PT Stop Time 0928    PT Time Calculation (min) 39 min    Activity Tolerance Patient tolerated treatment well    Behavior During Therapy [facility] for tasks assessed/performed             Past Medical History:  Diagnosis Date   Eczema    Migraine    mirena out 2015 so no longer have ha   Seasonal allergies     History reviewed. No pertinent surgical history.  There were no vitals filed for this visit.   Subjective Assessment - 03/11/21 0849     Subjective Pt denies pain today and no pain this week.    Limitations Standing    How long can you stand comfortably? standing as long as I want    Currently in Pain? No/denies                               [facility] Adult PT Treatment/Exercise - 03/11/21 0001       Lumbar Exercises: Stretches   Other Lumbar Stretch Exercise foam roll - lubar stretch single leg      Lumbar Exercises: Aerobic   Stationary Bike L3 x 5 min      Lumbar Exercises: Standing   Lifting Limitations 10 lb dead lift - 20x    Wall Slides 15 reps    Wall Slides Limitations slide then lifting yellow weighted ball    Other Standing Lumbar Exercises side step and monster walk green loop    Other Standing Lumbar Exercises staing on foam pad yellow ball raises      Lumbar Exercises: Sidelying   Hip Abduction 15 reps;Right;Left    Hip Abduction Weights (lbs) 2      Lumbar Exercises: Prone   Straight Leg Raise 20 reps   2lb with pillows under pelvis                      PT Short Term Goals - 12/24/20 1112       PT SHORT TERM GOAL #1   Title pt will report at least 25% less pain    Baseline 60% less    Status Achieved      PT SHORT TERM GOAL #2   Title ind with intial HEP    Status Achieved               PT Long Term Goals - 03/11/21 0857       PT LONG TERM GOAL #1   Title Be able to do housework for at least 2.5 hours without increased pain    Status Achieved      PT LONG TERM GOAL #2   Title Pt will be able to workout on pelaton bike for 30 minutes    Status Deferred      PT LONG TERM GOAL #3   Title Pt will be ind with advanced HEP  Status Achieved      PT LONG TERM GOAL #4   Title Pt will report at least 70% reduction in pain overall and understanding of how to manage pain at home for continued improvement    Baseline 75-80%    Status Achieved                   Plan - 03/11/21 1008     Clinical Impression Statement Pt is doing great.  She has not returned to [facility] but feels like it is not comfortable for other reasons.  Pt is expected to continue to make progress with her HEP and will d/c today    PT Treatment/Interventions ADLs/Self Care Home Management;Biofeedback;Cryotherapy;Software engineer;Therapeutic activities;Therapeutic exercise;Neuromuscular re-education;Patient/family education;Manual techniques;Taping;Dry needling;Passive range of motion    PT Next Visit Plan d/c'd today    Consulted and Agree with Plan of Care Patient             Patient will benefit from skilled therapeutic intervention in order to improve the following deficits and impairments:  Abnormal gait, Pain, Postural dysfunction, Impaired flexibility, Increased fascial restricitons, Decreased strength, Decreased coordination, Increased muscle spasms  Visit Diagnosis: Muscle weakness (generalized)  Cramp and spasm     Problem List Patient Active Problem List   Diagnosis  Date Noted   Term pregnancy 06/17/2020   Chest discomfort 12/06/2018   Indication for care in labor or delivery 05/01/2017   SVD (spontaneous vaginal delivery) 05/01/2017    Jule Ser, PT 03/11/2021, 10:17 AM  [facility] [address], [place], [place], [address] Phone: [phone]   Fax:  [phone]  Name: Misty Lewis MRN: [id number] Date of Birth: April 25, 1986   PHYSICAL THERAPY DISCHARGE SUMMARY  Visits from Start of Care: 8  Current functional level related to goals / functional outcomes: See above details and goals   Remaining deficits: See above details   Education / Equipment: HEP  Patient agrees to discharge. Patient goals were partially met. Patient is being discharged due to being pleased with the current functional level.  Gustavus Bryant, PT 03/11/21 10:17 AM

## 2021-04-25 ENCOUNTER — Telehealth (INDEPENDENT_AMBULATORY_CARE_PROVIDER_SITE_OTHER): Payer: Commercial Managed Care - PPO | Admitting: Nurse Practitioner

## 2021-04-25 ENCOUNTER — Other Ambulatory Visit: Payer: Self-pay

## 2021-04-25 ENCOUNTER — Encounter: Payer: Self-pay | Admitting: Nurse Practitioner

## 2021-04-25 VITALS — Wt 235.0 lb

## 2021-04-25 DIAGNOSIS — U071 COVID-19: Secondary | ICD-10-CM

## 2021-04-25 DIAGNOSIS — R059 Cough, unspecified: Secondary | ICD-10-CM

## 2021-04-25 NOTE — Progress Notes (Signed)
Virtual Visit via MyChart   This visit type was conducted due to national recommendations for restrictions regarding the COVID-19 Pandemic (e.g. social distancing) in an effort to limit this patient's exposure and mitigate transmission in our community.  Due to her co-morbid illnesses, this patient is at least at moderate risk for complications without adequate follow up.  This format is felt to be most appropriate for this patient at this time.  All issues noted in this document were discussed and addressed.  A limited physical exam was performed with this format.    This visit type was conducted due to national recommendations for restrictions regarding the COVID-19 Pandemic (e.g. social distancing) in an effort to limit this patient's exposure and mitigate transmission in our community.  Patients identity confirmed using two different identifiers.  This format is felt to be most appropriate for this patient at this time.  All issues noted in this document were discussed and addressed.  No physical exam was performed (except for noted visual exam findings with Video Visits).    Date:  04/25/2021   ID:  Misty Lewis, DOB 07/25/1986, MRN [id number]  Patient Location:  Home - spoke with [name]  Provider location:   Office    Chief Complaint:  positive covid  History of Present Illness:    Misty Lewis is a 35 y.o. female who presents via video conferencing for a telehealth visit today.    The patient does have symptoms concerning for COVID-19 infection (fever, chills, cough, or new shortness of breath).   Headache that started on Monday, then on Wednesday had tickling throat. On Thursday she had chills, fever (at night) up to 100.9. Friday did okay then on Saturday headache returned. She started with runny nose.  Did not lose taste or smell. Denies shortness of breath.  She does work from home but took today off. Her daughters were also sick with a virus.   She had a sinus  infection last month and felt as bad as she did now with covid.   Now she only has a cough, she is coughing up mucous - yellow color improved over the course of the day.     Past Medical History:  Diagnosis Date   Eczema    Migraine    mirena out 2015 so no longer have ha   Seasonal allergies    History reviewed. No pertinent surgical history.   Current Meds  Medication Sig   Cholecalciferol (VITAMIN D3) 50 MCG (2000 UT) CAPS Take 1 capsule by mouth daily at 2 am.   fluticasone (FLONASE) 50 MCG/ACT nasal spray Place 2 sprays into both nostrils daily.   Multiple Minerals (CALCIUM/MAGNESIUM/ZINC PO) Take 133 mg by mouth daily at 2 am.   Prenatal Vit-Fe Fumarate-FA (PRENATAL MULTIVITAMIN) TABS tablet Take 1 tablet by mouth daily at 12 noon.      Allergies:   Coconut flavor, Mushroom extract complex, Cherry, Mustard seed, and Other   Social History   Tobacco Use   Smoking status: Never   Smokeless tobacco: Never  Vaping Use   Vaping Use: Never used  Substance Use Topics   Alcohol use: No   Drug use: No     Family Hx: The patient's family history includes Diabetes in her maternal aunt and mother; Hyperlipidemia in her maternal aunt; Hypertension in her mother; Lupus in her maternal aunt; Lymphoma in her maternal aunt.  ROS:   Please see the history of present illness.  Review of Systems  Constitutional: Negative.  Negative for chills, fever and malaise/fatigue.  Respiratory:  Positive for cough. Negative for sputum production, shortness of breath and wheezing.   Cardiovascular: Negative.   Neurological:  Negative for dizziness and tingling.  Psychiatric/Behavioral: Negative.     All other systems reviewed and are negative.   Labs/Other Tests and Data Reviewed:    Recent Labs: 06/18/2020: Hemoglobin 10.0; Platelets 222   Recent Lipid Panel No results found for: CHOL, TRIG, HDL, CHOLHDL, LDLCALC, LDLDIRECT  Wt Readings from Last 3 Encounters:  04/25/21 235 lb  (106.6 kg)  02/01/21 230 lb (104.3 kg)  10/04/20 215 lb (97.5 kg)     Exam:    Vital Signs:  Wt 235 lb (106.6 kg)   Breastfeeding Yes   BMI 40.34 kg/m     Physical Exam Vitals reviewed.  Constitutional:      General: She is not in acute distress.    Appearance: Normal appearance.  Cardiovascular:     Rate and Rhythm: Normal rate and regular rhythm.     Pulses: Normal pulses.     Heart sounds: Normal heart sounds. No murmur heard. Pulmonary:     Effort: Pulmonary effort is normal. No respiratory distress.     Breath sounds: Normal breath sounds. No wheezing.  Skin:    Capillary Refill: Capillary refill takes less than 2 seconds.  Neurological:     General: No focal deficit present.     Mental Status: She is alert and oriented to person, place, and time.     Cranial Nerves: No cranial nerve deficit.     Motor: No weakness.    ASSESSMENT & PLAN:    1. COVID-19 She is doing better since testing positive for Covid last Wednesday, she does not need a work note. She can come out of quarantine tomorrow wearing a mask in public.   2. Cough Encouraged to stay well hydrated with water and can take Delsym or mucinex for her cough    COVID-19 Education: The signs and symptoms of COVID-19 were discussed with the patient and how to seek care for testing (follow up with PCP or arrange E-visit).  The importance of social distancing was discussed today.  Patient Risk:   After full review of this patients clinical status, I feel that they are at least moderate risk at this time.  Time:   Today, I have spent  9.05 minutes/ seconds with the patient with telehealth technology discussing above diagnoses.     Medication Adjustments/Labs and Tests Ordered: Current medicines are reviewed at length with the patient today.  Concerns regarding medicines are outlined above.   Tests Ordered: No orders of the defined types were placed in this encounter.   Medication Changes: No orders  of the defined types were placed in this encounter.   Disposition:  Follow up prn  Signed, Arnette Felts, FNP

## 2021-04-25 NOTE — Patient Instructions (Signed)
COVID-19: Quarantine and Isolation Quarantine If you were exposed Quarantine and stay away from others when you have been in close contact with someone whohas COVID-19. Isolate If you are sick or test positive Isolate when you are sick or when you have COVID-19, even if you don't have symptoms. When to stay home Calculating quarantine The date of your exposure is considered day 0. Day 1 is the first full day after your last contact with a person who has had COVID-19. Stay home and away from other people for at least 5 days. Learn why CDC updated guidance for the general public. IF YOU were exposed to COVID-19 and are NOT up-to-date IF YOU were exposed to COVID-19 and are NOT on COVID-19 vaccinations Quarantine for at least 5 days Stay home Stay home and quarantine for at least 5 full days. Wear a well-fitted mask if you must be around others in your home. Do not travel. Get tested Even if you don't develop symptoms, get tested at least 5 days after you last had close contact with someone with COVID-19. After quarantine Watch for symptoms Watch for symptoms until 10 days after you last had close contact with someone with COVID-19. Avoid travel It is best to avoid travel until a full 10 days after you last had close contact with someone with COVID-19. If you develop symptoms Isolate immediately and get tested. Continue to stay home until you know the results. Wear a well-fitted mask around others. Take precautions until day 10 Wear a mask Wear a well-fitted mask for 10 full days any time you are around others inside your home or in public. Do not go to places where you are unable to wear a mask. If you must travel during days 6-10, take precautions. Avoid being around people who are at high risk IF YOU were exposed to COVID-19 and are up-to-date IF YOU were exposed to COVID-19 and are on COVID-19 vaccinations No quarantine You do not need to stay home unless you develop  symptoms. Get tested Even if you don't develop symptoms, get tested at least 5 days after you last had close contact with someone with COVID-19. Watch for symptoms Watch for symptoms until 10 days after you last had close contact with someone with COVID-19. If you develop symptoms Isolate immediately and get tested. Continue to stay home until you know the results. Wear a well-fitted mask around others. Take precautions until day 10 Wear a mask Wear a well-fitted mask for 10 full days any time you are around others inside your home or in public. Do not go to places where you are unable to wear a mask. Take precautions if traveling Avoid being around people who are at high risk IF YOU were exposed to COVID-19 and had confirmed COVID-19 within the past 90 days (you tested positive using a viral test) No quarantine You do not need to stay home unless you develop symptoms. Watch for symptoms Watch for symptoms until 10 days after you last had close contact with someone with COVID-19. If you develop symptoms Isolate immediately and get tested. Continue to stay home until you know the results. Wear a well-fitted mask around others. Take precautions until day 10 Wear a mask Wear a well-fitted mask for 10 full days any time you are around others inside your home or in public. Do not go to places where you are unable to wear a mask. Take precautions if traveling Avoid being around people who are at high risk Calculating isolation   Day 0 is your first day of symptoms or a positive viral test. Day 1 is the first full day after your symptoms developed or your test specimen was collected. If you have COVID-19 or have symptoms, isolate for at least 5 days. IF YOU tested positive for COVID-19 or have symptoms, regardless of vaccination status Stay home for at least 5 days Stay home for 5 days and isolate from others in your home. Wear a well-fitted mask if you must be around others in your home. Do not  travel. Ending isolation if you had symptoms End isolation after 5 full days if you are fever-free for 24 hours (without the use of fever-reducing medication) and your symptoms are improving. Ending isolation if you did NOT have symptoms End isolation after at least 5 full days after your positive test. If you were severely ill with COVID-19 or are immunocompromised You should isolate for at least 10 days. Consult your doctor before ending isolation. Take precautions until day 10 Wear a mask Wear a well-fitted mask for 10 full days any time you are around others inside your home or in public. Do not go to places where you are unable to wear a mask. Do not travel Do not travel until a full 10 days after your symptoms started or the date your positive test was taken if you had no symptoms. Avoid being around people who are at high risk Definitions Exposure Contact with someone infected with SARS-CoV-2, the virus that causes COVID-19,in a way that increases the likelihood of getting infected with the virus. Close contact A close contact is someone who was less than 6 feet away from an infected person (laboratory-confirmed or a clinical diagnosis) for a cumulative total of 15 minutes or more over a 24-hour period. For example, three individual 5-minute exposures for a total of 15 minutes. People who are exposed to someone with COVID-19 after they completed at least 5 days of isolation are notconsidered close contacts. Quarantine Quarantine is a strategy used to prevent transmission of COVID-19 by keeping people who have been in close contact with someone with COVID-19 apart from others. Who does not need to quarantine? If you had close contact with someone with COVID-19 and you are in one of the following groups, you do not need to quarantine. You are up to date with your COVID-19 vaccines. You had confirmed COVID-19 within the last 90 days (meaning you tested positive using a viral test). You  should wear a well-fitting mask around others for 10 days from the date of your last close contact with someone with COVID-19 (the date of last close contact is considered day 0). Get tested at least 5 days after you last had close contact with someone with COVID-19. If you test positive or develop COVID-19 symptoms, isolate from other people and follow recommendations in the Isolation section below. If you tested positive for COVID-19 with a viral test within the previous 90 days and subsequently recovered and remain without COVID-19 symptoms, you do not need to quarantine or get tested after close contact. You should wear a well-fitting mask around others for 10 days from the date of your last close contact withsomeone with COVID-19 (the date of last close contact is considered day 0). Who should quarantine? If you come into close contact with someone with COVID-19, you should quarantine if you are not up to date on COVID-19 vaccines. This includes people who are not vaccinated. What to do for quarantine Stay home and away   from other people for at least 5 days (day 0 through day 5) after your last contact with a person who has COVID-19. The date of your exposure is considered day 0. Wear a well-fitting mask when around others at home, if possible. For 10 days after your last close contact with someone with COVID-19, watch for fever (100.4F or greater), cough, shortness of breath, or other COVID-19 symptoms. If you develop symptoms, get tested immediately and isolate until you receive your test results. If you test positive, follow isolation recommendations. If you do not develop symptoms, get tested at least 5 days after you last had close contact with someone with COVID-19. If you test negative, you can leave your home, but continue to wear a well-fitting mask when around others at home and in public until 10 days after your last close contact with someone with COVID-19. If you test positive, you should  isolate for at least 5 days from the date of your positive test (if you do not have symptoms). If you do develop COVID-19 symptoms, isolate for at least 5 days from the date your symptoms began (the date the symptoms started is day 0). Follow recommendations in the isolation section below. If you are unable to get a test 5 days after last close contact with someone with COVID-19, you can leave your home after day 5 if you have been without COVID-19 symptoms throughout the 5-day period. Wear a well-fitting mask for 10 days after your date of last close contact when around others at home and in public. Avoid people who are immunocompromised or at high risk for severe disease, and nursing homes and other high-risk settings, until after at least 10 days. If possible, stay away from people you live with, especially people who are at higher risk for getting very sick from COVID-19, as well as others outside your home throughout the full 10 days after your last close contact with someone with COVID-19. If you are unable to quarantine, you should wear a well-fitting mask for 10 days when around others at home and in public. If you are unable to wear a mask when around others, you should continue to quarantine for 10 days. Avoid people who are immunocompromised or at high risk for severe disease, and nursing homes and other high-risk settings, until after at least 10 days. See additional information about travel. Do not go to places where you are unable to wear a mask, such as restaurants and some gyms, and avoid eating around others at home and at work until after 10 days after your last close contact with someone with COVID-19. After quarantine Watch for symptoms until 10 days after your last close contact with someone with COVID-19. If you have symptoms, isolate immediately and get tested. Quarantine in high-risk congregate settings In certain congregate settings that have high risk of secondary transmission  (such as correctional and detention facilities, homeless shelters, or cruise ships), CDC recommends a 10-day quarantine for residents, regardless of vaccination and booster status. During periods of critical staffing shortages, facilities may consider shortening the quarantine period for staff to ensure continuity of operations. Decisions to shorten quarantine in these settings should be made in consultation with state, local, tribal, or territorial health departments and should take into consideration the context and characteristics of the facility. CDC's setting-specific guidance provides additional recommendations for these settings. Isolation Isolation is used to separate people with confirmed or suspected COVID-19 from those without COVID-19. People who are in isolation should stay   home until it's safe for them to be around others. At home, anyone sick or infected should separate from others, or wear a well-fitting mask when they need to be around others. People in isolation should stay in a specific "sick room" or area and use a separate bathroom if available. Everyone who has presumed or confirmed COVID-19 should stay home and isolate from other people for at least 5 full days (day 0 is the first day of symptoms or the date of the day of the positive viral test for asymptomatic persons). They should wear a mask when around others at home and in public for an additional 5 days. People who are confirmed to have COVID-19 or are showing symptoms of COVID-19 need to isolate regardless of their vaccination status. This includes: People who have a positive viral test for COVID-19, regardless of whether or not they have symptoms. People with symptoms of COVID-19, including people who are awaiting test results or have not been tested. People with symptoms should isolate even if they do not know if they have been in close contact with someone with COVID-19. What to do for isolation Monitor your symptoms. If you  have an emergency warning sign (including trouble breathing), seek emergency medical care immediately. Stay in a separate room from other household members, if possible. Use a separate bathroom, if possible. Take steps to improve ventilation at home, if possible. Avoid contact with other members of the household and pets. Don't share personal household items, like cups, towels, and utensils. Wear a well-fitting mask when you need to be around other people. Learn more about what to do if you are sick and how to notify your contacts. Ending isolation for people who had COVID-19 and had symptoms If you had COVID-19 and had symptoms, isolate for at least 5 days. To calculate your 5-day isolation period, day 0 is your first day of symptoms. Day 1 is the first full day after your symptoms developed. You can leave isolation after 5 full days. You can end isolation after 5 full days if you are fever-free for 24 hours without the use of fever-reducing medication and your other symptoms have improved (Loss of taste and smell may persist for weeks or months after recovery and need not delay the end of isolation). You should continue to wear a well-fitting mask around others at home and in public for 5 additional days (day 6 through day 10) after the end of your 5-day isolation period. If you are unable to wear a mask when around others, you should continue to isolate for a full 10 days. Avoid people who are immunocompromised or at high risk for severe disease, and nursing homes and other high-risk settings, until after at least 10 days. If you continue to have fever or your other symptoms have not improved after 5 days of isolation, you should wait to end your isolation until you are fever-free for 24 hours without the use of fever-reducing medication and your other symptoms have improved. Continue to wear a well-fitting mask. Contact your healthcare provider if you have questions. See additional information about  travel. Do not go to places where you are unable to wear a mask, such as restaurants and some gyms, and avoid eating around others at home and at work until a full 10 days after your first day of symptoms. If an individual has access to a test and wants to test, the best approach is to use an antigen test1 towards the end of   the 5-day isolation period. Collect the test sample only if you are fever-free for 24 hours without the use of fever-reducing medication and your other symptoms have improved (loss of taste and smell may persist for weeks or months after recovery and need not delay the end of isolation). If your test result is positive, you should continue to isolate until day 10. If your test result is negative, you can end isolation, but continue to wear a well-fitting mask around others at home and in public until day 10. Follow additional recommendations for masking and avoiding travel as described above. 1As noted in the labeling for authorized over-the counter antigen tests: Negative results should be treated as presumptive. Negative results do not rule out SARS-CoV-2 infection and should not be used as the sole basis for treatment or patient management decisions, including infection control decisions. To improve results, antigen tests should be used twice over a three-day period with at least 24 hours and no more than 48 hours between tests. Note that these recommendations on ending isolation do not apply to people with moderate or severe COVID-19 or with weakened immune systems (immunocompromised). See section below for recommendations for when toend isolation for these groups. Ending isolation for people who tested positive for COVID-19 but had no symptoms If you test positive for COVID-19 and never develop symptoms, isolate for at least 5 days. Day 0 is the day of your positive viral test (based on the date you were tested) and day 1 is the first full day after the specimen was collected for your  positive test. You can leave isolation after 5 full days. If you continue to have no symptoms, you can end isolation after at least 5 days. You should continue to wear a well-fitting mask around others at home and in public until day 10 (day 6 through day 10). If you are unable to wear a mask when around others, you should continue to isolate for 10 days. Avoid people who are immunocompromised or at high risk for severe disease, and nursing homes and other high-risk settings, until after at least 10 days. If you develop symptoms after testing positive, your 5-day isolation period should start over. Day 0 is your first day of symptoms. Follow the recommendations above for ending isolation for people who had COVID-19 and had symptoms. See additional information about travel. Do not go to places where you are unable to wear a mask, such as restaurants and some gyms, and avoid eating around others at home and at work until 10 days after the day of your positive test. If an individual has access to a test and wants to test, the best approach is to use an antigen test1 towards the end of the 5-day isolation period. If your test result is positive, you should continue to isolate until day 10. If your test result is negative, you can end isolation, but continue to wear a well-fitting mask around others at home and in public until day 10. Follow additionalrecommendations for masking and avoiding travel as described above. 1As noted in the labeling for authorized over-the counter antigen tests: Negative results should be treated as presumptive. Negative results do not rule out SARS-CoV-2 infection and should not be used as the sole basis for treatment or patient management decisions, including infection control decisions. To improve results, antigen tests should be used twice over a three-day period with at least 24 hours and no more than 48 hours between tests. Ending isolation for people who were   severely ill with  COVID-19 or have a weakened immune system (immunocompromised) People who are severely ill with COVID-19 (including those who were hospitalized or required intensive care or ventilation support) and people with compromised immune systems might need to isolate at home longer. They may also require testing with a viral test to determine when they can be around others. CDC recommends an isolation period of at least 10 and up to 20 days for people who were severely ill with COVID-19 and for people with weakened immune systems. Consult with your healthcare provider about when you can resume being aroundother people. People who are immunocompromised should talk to their healthcare provider about the potential for reduced immune responses to COVID-19 vaccines and the need to continue to follow current prevention measures (including wearing a well-fitting mask, staying 6 feet apart from others they don't live with, and avoiding crowds and poorly ventilated indoor spaces) to protect themselves against COVID-19 until advised otherwise by their healthcare provider. Close contacts of immunocompromised people--including household members--should also be encouraged to receive all recommended COVID-19 vaccine doses to help protect these people. Isolation in high-risk congregate settings In certain high-risk congregate settings that have high risk of secondary transmission and where it is not feasible to cohort people (such as correctional and detention facilities, homeless shelters, and cruise ships), CDC recommends a 10-day isolation period for residents. During periods of critical staffing shortages, facilities may consider shortening the isolation period for staff to ensure continuity of operations. Decisions to shorten isolation in these settings should be made in consultation with state, local, tribal, or territorial health departments and should take into consideration the context and characteristics of the facility.  CDC's setting-specific guidance provides additional recommendations for these settings. This CDC guidance is meant to supplement--not replace--any federal, state, local,territorial, or tribal health and safety laws, rules, and regulations. Recommendations for specific settings These recommendations do not apply to healthcare professionals. For guidance specific to these settings, see Healthcare professionals: Interim Guidance for Managing Healthcare Personnel with SARS-CoV-2 Infection or Exposure to SARS-CoV-2 Patients, residents, and visitors to healthcare settings: Interim Infection Prevention and Control Recommendations for Healthcare Personnel During the Coronavirus Disease 2019 (COVID-19) Pandemic Additional setting-specific guidance and recommendations are available. These recommendations on quarantine and isolation do apply to K-12 School settings. Additional guidance is available here: Overview of COVID-19 Quarantine for K-12 Schools Travelers: Travel information and recommendations Congregate facilities and other settings: guidance pages for community, work, and school settings Ongoing COVID-19 exposure FAQs I live with someone with COVID-19, but I cannot be separated from them. How do we manage quarantine in this situation? It is very important for people with COVID-19 to remain apart from other people, if possible, even if they are living together. If separation of the person with COVID-19 from others that they live with is not possible, the other people that they live with will have ongoing exposure, meaning they will be repeatedly exposed until that person is no longer able to spread the virus to other people. In this situation, there are precautions you can take to limit the spread of COVID-19: The person with COVID-19 and everyone they live with should wear a well-fitting mask inside the home. If possible, one person should care for the person with COVID-19 to limit the number of people  who are in close contact with the infected person. Take steps to protect yourself and others to reduce transmission in the home: Quarantine if you are not up to date with your COVID-19   vaccines. Isolate if you are sick or tested positive for COVID-19, even if you don't have symptoms. Learn more about the public health recommendations for testing, mask use and quarantine of close contacts, like yourself, who have ongoing exposure. These recommendations differ depending on your vaccination status. What should I do if I have ongoing exposure to COVID-19 from someone I live with? Recommendations for this situation depend on your vaccination status: If you are not up to date on COVID-19 vaccines and have ongoing exposure to COVID-19, you should: Begin quarantine immediately and continue to quarantine throughout the isolation period of the person with COVID-19. Continue to quarantine for an additional 5 days starting the day after the end of isolation for the person with COVID-19. Get tested at least 5 days after the end of isolation of the infected person that lives with them. If you test negative, you can leave the home but should continue to wear a well-fitting mask when around others at home and in public until 10 days after the end of isolation for the person with COVID-19. Isolate immediately if you develop symptoms of COVID-19 or test positive. If you are up to date with COVID-19 vaccines and have ongoing exposure to COVID-19, you should: Get tested at least 5 days after your first exposure. A person with COVID-19 is considered infectious starting 2 days before they develop symptoms, or 2 days before the date of their positive test if they do not have symptoms. Get tested again at least 5 days after the end of isolation for the person with COVID-19. Wear a well-fitting mask when you are around the person with COVID-19, and do this throughout their isolation period. Wear a well-fitting mask around  others for 10 days after the infected person's isolation period ends. Isolate immediately if you develop symptoms of COVID-19 or test positive. What should I do if multiple people I live with test positive for COVID-19 at different times? Recommendations for this situation depend on your vaccination status: If you are not up to date with your COVID-19 vaccines, you should: Quarantine throughout the isolation period of any infected person that you live with. Continue to quarantine until 5 days after the end of isolation date for the most recently infected person that lives with you. For example, if the last day of isolation of the person most recently infected with COVID-19 was June 30, the new 5-day quarantine period starts on July 1. Get tested at least 5 days after the end of isolation for the most recently infected person that lives with you. Wear a well-fitting mask when you are around any person with COVID-19 while that person is in isolation. Wear a well-fitting mask when you are around other people until 10 days after your last close contact. Isolate immediately if you develop symptoms of COVID-19 or test positive. If you are up to date with COVID-19 your vaccines , you should: Get tested at least 5 days after your first exposure. A person with COVID-19 is considered infectious starting 2 days before they developed symptoms, or 2 days before the date of their positive test if they do not have symptoms. Get tested again at least 5 days after the end of isolation for the most recently infected person that lives with you. Wear a well-fitting mask when you are around any person with COVID-19 while that person is in isolation. Wear a well-fitting mask around others for 10 days after the end of isolation for the most recently infected person that   lives with you. For example, if the last day of isolation for the person most recently infected with COVID-19 was June 30, the new 10-day period to wear a  well-fitting mask indoors in public starts on July 1. Isolate immediately if you develop symptoms of COVID-19 or test positive. I had COVID-19 and completed isolation. Do I have to quarantine or get tested if someone I live with gets COVID-19 shortly after I completed isolation? No. If you recently completed isolation and someone that lives with you tests positive for the virus that causes COVID-19 shortly after the end of your isolation period, you do not have to quarantine or get tested as long as you do not develop new symptoms. Once all of the people that live together have completed isolation or quarantine, refer to the guidance below for new exposures to COVID-19. If you had COVID-19 in the previous 90 days and then came into close contact with someone with COVID-19, you do not have to quarantine or get tested if you do not have symptoms. But you should: Wear a well-fitting mask indoors in public for 10 days after exposure. Monitor for COVID-19 symptoms and isolate immediately if symptoms develop. Consult with a healthcare provider for testing recommendations if new symptoms develop. If more than 90 days have passed since your recovery from infection, follow CDC's recommendations for close contacts. These recommendations will differ depending on your vaccination status. 10/21/2020 Content source: National Center for Immunization and Respiratory Diseases (NCIRD), Division of Viral Diseases This information is not intended to replace advice given to you by your health care provider. Make sure you discuss any questions you have with your healthcare provider. Document Revised: 10/29/2020 Document Reviewed: 10/29/2020 Elsevier Patient Education  2022 Elsevier Inc.  

## 2021-08-09 ENCOUNTER — Encounter: Payer: Self-pay | Admitting: Nurse Practitioner

## 2021-08-09 ENCOUNTER — Ambulatory Visit (INDEPENDENT_AMBULATORY_CARE_PROVIDER_SITE_OTHER): Payer: BC Managed Care – PPO | Admitting: Nurse Practitioner

## 2021-08-09 ENCOUNTER — Other Ambulatory Visit: Payer: Self-pay

## 2021-08-09 VITALS — BP 118/72 | HR 60 | Temp 98.1°F | Ht 64.0 in | Wt 232.2 lb

## 2021-08-09 DIAGNOSIS — J3489 Other specified disorders of nose and nasal sinuses: Secondary | ICD-10-CM

## 2021-08-09 DIAGNOSIS — R051 Acute cough: Secondary | ICD-10-CM | POA: Diagnosis not present

## 2021-08-09 DIAGNOSIS — E6609 Other obesity due to excess calories: Secondary | ICD-10-CM

## 2021-08-09 DIAGNOSIS — Z6839 Body mass index (BMI) 39.0-39.9, adult: Secondary | ICD-10-CM | POA: Diagnosis not present

## 2021-08-09 MED ORDER — BENZONATATE 100 MG PO CAPS: 100.0000 mg | ORAL_CAPSULE | Freq: Four times a day (QID) | ORAL | 1 refills | Status: DC | PRN

## 2021-08-09 MED ORDER — FLUTICASONE PROPIONATE 50 MCG/ACT NA SUSP: 2.0000 | Freq: Every day | NASAL | 2 refills | Status: DC

## 2021-08-09 NOTE — Patient Instructions (Signed)
Sore Throat When you have a sore throat, your throat may feel: Tender. Burning. Irritated. Scratchy. Painful when you swallow. Painful when you talk. Many things can cause a sore throat, such as: An infection. Allergies. Dry air. Smoke or pollution. Radiation treatment for cancer. Gastroesophageal reflux disease (GERD). A tumor. A sore throat can be the first sign of another sickness. It can happen with other problems, like: Coughing. Sneezing. Fever. Swelling of the glands in the neck. Most sore throats go away without treatment. Follow these instructions at home:   Medicines Take over-the-counter and prescription medicines only as told by your doctor. Children often get sore throats. Do not give your child aspirin. Use throat sprays to soothe your throat as told by your health care provider. Managing pain To help with pain: Sip warm liquids, such as broth, herbal tea, or warm water. Eat or drink cold or frozen liquids, such as frozen ice pops. Rinse your mouth (gargle) with a salt water mixture 3-4 times a day or as needed. To make salt water, dissolve -1 tsp (3-6 g) of salt in 1 cup (237 mL) of warm water. Do not swallow this mixture. Suck on hard candy or throat lozenges. Put a cool-mist humidifier in your bedroom at night. Sit in the bathroom with the door closed for 5-10 minutes while you run hot water in the shower. General instructions Do not smoke or use any products that contain nicotine or tobacco. If you need help quitting, ask your doctor. Get plenty of rest. Drink enough fluid to keep your pee (urine) pale yellow. Wash your hands often for at least 20 seconds with soap and water. If soap and water are not available, use hand sanitizer. Contact a doctor if: You have a fever for more than 2-3 days. You keep having symptoms for more than 2-3 days. Your throat does not get better in 7 days. You have a fever and your symptoms suddenly get worse. Your child  who is 3 months to 3 years old has a temperature of 102.2F (39C) or higher. Get help right away if: You have trouble breathing. You cannot swallow fluids, soft foods, or your spit. You have swelling in your throat or neck that gets worse. You feel like you may vomit (nauseous) and this feeling lasts a long time. You cannot stop vomiting. These symptoms may be an emergency. Get help right away. Call your local emergency services (911 in the [place] Do not wait to see if the symptoms will go away. Do not drive yourself to the hospital. Summary A sore throat is a painful, burning, irritated, or scratchy throat. Many things can cause a sore throat. Take over-the-counter medicines only as told by your doctor. Get plenty of rest. Drink enough fluid to keep your pee (urine) pale yellow. Contact a doctor if your symptoms get worse or your sore throat does not get better within 7 days. This information is not intended to replace advice given to you by your health care provider. Make sure you discuss any questions you have with your health care provider. Document Revised: 12/08/2020 Document Reviewed: 12/08/2020 Elsevier Patient Education  2022 Elsevier Inc.  

## 2021-08-09 NOTE — Progress Notes (Signed)
I,Victoria T Hamilton,acting as a Neurosurgeon for Arnette Felts, FNP.,have documented all relevant documentation on the behalf of Arnette Felts, FNP,as directed by  Arnette Felts, FNP while in the presence of Arnette Felts, FNP.   This visit occurred during the SARS-CoV-2 public health emergency.  Safety protocols were in place, including screening questions prior to the visit, additional usage of staff PPE, and extensive cleaning of exam room while observing appropriate contact time as indicated for disinfecting solutions.  Subjective:     Patient ID: Misty Lewis , female    DOB: March 14, 1986 , 35 y.o.   MRN: [id number]   Chief Complaint  Patient presents with   URI     HPI  Pt presents today with mild cough. She started with a sore throat on Thursday but improved at this time.  She tested for covid every other day at home, all tests came back negative. She reports to symptoms starting Thursday. She had started checking a home covid test since last Tuesday due to her daughter having a fever.  Patient also experienced blood in phlegm, only once. She had an episode of red phlegm on Saturday and continues to have a hacking cough.  She says this was the soonest appointment available, before scheduling she did try to visit ED.        Past Medical History:  Diagnosis Date   Eczema    Migraine    mirena out 2015 so no longer have ha   Seasonal allergies      Family History  Problem Relation Age of Onset   Diabetes Mother    Hypertension Mother    Lupus Maternal Aunt    Lymphoma Maternal Aunt    Diabetes Maternal Aunt    Hyperlipidemia Maternal Aunt      Current Outpatient Medications:    Cholecalciferol (VITAMIN D3) 50 MCG (2000 UT) CAPS, Take 1 capsule by mouth daily at 2 am., Disp: , Rfl:    Multiple Minerals (CALCIUM/MAGNESIUM/ZINC PO), Take 133 mg by mouth daily at 2 am., Disp: , Rfl:    Prenatal Vit-Fe Fumarate-FA (PRENATAL MULTIVITAMIN) TABS tablet, Take 1 tablet by mouth daily  at 12 noon. , Disp: , Rfl:    fluticasone (FLONASE) 50 MCG/ACT nasal spray, Place 2 sprays into both nostrils daily., Disp: 18.2 g, Rfl: 2   Allergies  Allergen Reactions   Coconut Flavor Anaphylaxis and Nausea And Vomiting   Mushroom Extract Complex Anaphylaxis and Nausea And Vomiting    mushrooms   Cherry Nausea And Vomiting   Mustard Seed Nausea And Vomiting    Condiment- Mustard    Other Hives    olives     Review of Systems  Constitutional: Negative.  Negative for fatigue.  Respiratory:  Positive for cough.   Cardiovascular: Negative.  Negative for chest pain, palpitations and leg swelling.  Neurological: Negative.  Negative for dizziness and headaches.  Psychiatric/Behavioral: Negative.      Today's Vitals   08/09/21 1536  BP: 118/72  Pulse: 60  Temp: 98.1 F (36.7 C)  Weight: 232 lb 3.2 oz (105.3 kg)  Height: 5\' 4"  (1.626 m)   Body mass index is 39.86 kg/m.  Wt Readings from Last 3 Encounters:  08/09/21 232 lb 3.2 oz (105.3 kg)  04/25/21 235 lb (106.6 kg)  02/01/21 230 lb (104.3 kg)    Objective:  Physical Exam Vitals reviewed.  Constitutional:      General: She is not in acute distress.    Appearance: Normal appearance.  Cardiovascular:     Pulses: Normal pulses.     Heart sounds: Normal heart sounds. No murmur heard. Pulmonary:     Effort: Pulmonary effort is normal. No respiratory distress.     Breath sounds: No wheezing.     Comments: Dry cough noted during virtual visit.  Neurological:     General: No focal deficit present.     Mental Status: She is alert and oriented to person, place, and time.     Cranial Nerves: No cranial nerve deficit.     Motor: No weakness.  Psychiatric:        Mood and Affect: Mood normal.        Behavior: Behavior normal.        Thought Content: Thought content normal.        Judgment: Judgment normal.        Assessment And Plan:     1. Acute cough Symptoms are improving, will check for covid with PCR as well -  Novel Coronavirus, NAA ([facility])  2. Class 2 obesity due to excess calories without serious comorbidity with body mass index (BMI) of 39.0 to 39.9 in adult  She is encouraged to strive for BMI less than 30 to decrease cardiac risk. Advised to aim for at least 150 minutes of exercise per week.   Patient was given opportunity to ask questions. Patient verbalized understanding of the plan and was able to repeat key elements of the plan. All questions were answered to their satisfaction.  Arnette Felts, FNP    I, Arnette Felts, FNP, have reviewed all documentation for this visit. The documentation on 08/09/21 for the exam, diagnosis, procedures, and orders are all accurate and complete.   IF YOU HAVE BEEN REFERRED TO A SPECIALIST, IT MAY TAKE 1-2 WEEKS TO SCHEDULE/PROCESS THE REFERRAL. IF YOU HAVE NOT HEARD FROM US/SPECIALIST IN TWO WEEKS, PLEASE GIVE [place] A CALL AT [phone] X [phone].   THE PATIENT IS ENCOURAGED TO PRACTICE SOCIAL DISTANCING DUE TO THE COVID-19 PANDEMIC.

## 2021-08-10 LAB — NOVEL CORONAVIRUS, NAA

## 2021-08-21 ENCOUNTER — Encounter: Payer: Self-pay | Admitting: Nurse Practitioner

## 2021-11-30 DIAGNOSIS — Z130 Encounter for screening for diseases of the blood and blood-forming organs and certain disorders involving the immune mechanism: Secondary | ICD-10-CM | POA: Diagnosis not present

## 2021-11-30 DIAGNOSIS — Z1389 Encounter for screening for other disorder: Secondary | ICD-10-CM | POA: Diagnosis not present

## 2021-11-30 DIAGNOSIS — R232 Flushing: Secondary | ICD-10-CM | POA: Diagnosis not present

## 2021-11-30 DIAGNOSIS — Z01411 Encounter for gynecological examination (general) (routine) with abnormal findings: Secondary | ICD-10-CM | POA: Diagnosis not present

## 2022-02-02 ENCOUNTER — Encounter: Payer: BC Managed Care – PPO | Admitting: Nurse Practitioner

## 2022-02-18 ENCOUNTER — Telehealth: Payer: BC Managed Care – PPO | Admitting: Nurse Practitioner

## 2022-02-18 DIAGNOSIS — J069 Acute upper respiratory infection, unspecified: Secondary | ICD-10-CM | POA: Diagnosis not present

## 2022-02-18 MED ORDER — AMOXICILLIN-POT CLAVULANATE 875-125 MG PO TABS: 1.0000 | ORAL_TABLET | Freq: Two times a day (BID) | ORAL | 0 refills | Status: DC

## 2022-02-18 NOTE — Progress Notes (Signed)
Virtual Visit Consent   [name], you are scheduled for a virtual visit with Mary-Margaret Daphine Deutscher, FNP, a [facility] provider, today.     Just as with appointments in the office, your consent must be obtained to participate.  Your consent will be active for this visit and any virtual visit you may have with one of our providers in the next 365 days.     If you have a MyChart account, a copy of this consent can be sent to you electronically.  All virtual visits are billed to your insurance company just like a traditional visit in the office.    As this is a virtual visit, video technology does not allow for your provider to perform a traditional examination.  This may limit your provider's ability to fully assess your condition.  If your provider identifies any concerns that need to be evaluated in person or the need to arrange testing (such as labs, EKG, etc.), we will make arrangements to do so.     Although advances in technology are sophisticated, we cannot ensure that it will always work on either your end or our end.  If the connection with a video visit is poor, the visit may have to be switched to a telephone visit.  With either a video or telephone visit, we are not always able to ensure that we have a secure connection.     I need to obtain your verbal consent now.   Are you willing to proceed with your visit today? YES   SAKI LEGORE has provided verbal consent on 02/18/2022 for a virtual visit (video or telephone).   Mary-Margaret Daphine Deutscher, FNP   Date: 02/18/2022 8:24 AM   Virtual Visit via Video Note   I, Mary-Margaret Daphine Deutscher, connected with Misty Lewis ([id number], Oct 24, 1985) on 02/18/22 at  8:15 AM EDT by a video-enabled telemedicine application and verified that I am speaking with the correct person using two identifiers.  Location: Patient: Virtual Visit Location Patient: Home Provider: Virtual Visit Location Provider: Mobile   I discussed the  limitations of evaluation and management by telemedicine and the availability of in person appointments. The patient expressed understanding and agreed to proceed.    History of Present Illness: Misty Lewis is a 36 y.o. who identifies as a female who was assigned female at birth, and is being seen today for uri .  HPI: URI  Associated symptoms include congestion, coughing (productive), headaches, rhinorrhea, sinus pain and a sore throat. She has tried acetaminophen for the symptoms. The treatment provided mild relief.  Patient is breastfeeding Review of Systems  HENT:  Positive for congestion, rhinorrhea, sinus pain and sore throat.   Respiratory:  Positive for cough (productive).   Neurological:  Positive for headaches.   Problems:  Patient Active Problem List   Diagnosis Date Noted   Term pregnancy 06/17/2020   Chest discomfort 12/06/2018   Indication for care in labor or delivery 05/01/2017   SVD (spontaneous vaginal delivery) 05/01/2017    Allergies:  Allergies  Allergen Reactions   Coconut Flavor Anaphylaxis and Nausea And Vomiting   Mushroom Extract Complex Anaphylaxis and Nausea And Vomiting    mushrooms   Cherry Nausea And Vomiting   Mustard Seed Nausea And Vomiting    Condiment- Mustard    Other Hives    olives   Medications:  Current Outpatient Medications:    amoxicillin-clavulanate (AUGMENTIN) 875-125 MG tablet, Take 1 tablet by mouth 2 (two) times daily.,  Disp: 14 tablet, Rfl: 0   Cholecalciferol (VITAMIN D3) 50 MCG (2000 UT) CAPS, Take 1 capsule by mouth daily at 2 am., Disp: , Rfl:    fluticasone (FLONASE) 50 MCG/ACT nasal spray, Place 2 sprays into both nostrils daily., Disp: 18.2 g, Rfl: 2   Multiple Minerals (CALCIUM/MAGNESIUM/ZINC PO), Take 133 mg by mouth daily at 2 am., Disp: , Rfl:    Prenatal Vit-Fe Fumarate-FA (PRENATAL MULTIVITAMIN) TABS tablet, Take 1 tablet by mouth daily at 12 noon. , Disp: , Rfl:   Observations/Objective: Patient is  well-developed, well-nourished in no acute distress.  Resting comfortably  at home.  Head is normocephalic, atraumatic.  No labored breathing.  Speech is clear and coherent with logical content.  Patient is alert and oriented at baseline.  Raspy voice Deep cough  Assessment and Plan:  Jahna BORTLE in today with chief complaint of URI   1. URI with cough and congestion 1. Take meds as prescribed 2. Use a cool mist humidifier especially during the winter months and when heat has been humid. 3. Use saline nose sprays frequently 4. Saline irrigations of the nose can be very helpful if done frequently.  * 4X daily for 1 week*  * Use of a nettie pot can be helpful with this. Follow directions with this* 5. Drink plenty of fluids 6. Keep thermostat turn down low 7.For any cough or congestion- robitussin 8. For fever or aces or pains- take tylenol or ibuprofen appropriate for age and weight.  * for fevers greater than 101 orally you may alternate ibuprofen and tylenol every  3 hours.   Meds ordered this encounter  Medications   amoxicillin-clavulanate (AUGMENTIN) 875-125 MG tablet    Sig: Take 1 tablet by mouth 2 (two) times daily.    Dispense:  14 tablet    Refill:  0    Order Specific Question:   Supervising Provider    Answer:   Eber Hong [[id number]]      Follow Up Instructions: I discussed the assessment and treatment plan with the patient. The patient was provided an opportunity to ask questions and all were answered. The patient agreed with the plan and demonstrated an understanding of the instructions.  A copy of instructions were sent to the patient via MyChart.  The patient was advised to call back or seek an in-person evaluation if the symptoms worsen or if the condition fails to improve as anticipated.  Time:  I spent 8 minutes with the patient via telehealth technology discussing the above problems/concerns.    Mary-Margaret Daphine Deutscher, FNP

## 2022-02-18 NOTE — Patient Instructions (Signed)
1. Take meds as prescribed 2. Use a cool mist humidifier especially during the winter months and when heat has been humid. 3. Use saline nose sprays frequently 4. Saline irrigations of the nose can be very helpful if done frequently.  * 4X daily for 1 week*  * Use of a nettie pot can be helpful with this. Follow directions with this* 5. Drink plenty of fluids 6. Keep thermostat turn down low 7.For any cough or congestion- robitussin 8. For fever or aces or pains- take tylenol or ibuprofen appropriate for age and weight.  * for fevers greater than 101 orally you may alternate ibuprofen and tylenol every  3 hours.    

## 2022-09-28 ENCOUNTER — Other Ambulatory Visit: Payer: Self-pay

## 2022-09-28 ENCOUNTER — Emergency Department (HOSPITAL_BASED_OUTPATIENT_CLINIC_OR_DEPARTMENT_OTHER): Payer: BC Managed Care – PPO | Admitting: Radiology

## 2022-09-28 ENCOUNTER — Encounter (HOSPITAL_BASED_OUTPATIENT_CLINIC_OR_DEPARTMENT_OTHER): Payer: Self-pay | Admitting: Emergency Medicine

## 2022-09-28 ENCOUNTER — Emergency Department (HOSPITAL_BASED_OUTPATIENT_CLINIC_OR_DEPARTMENT_OTHER)
Admission: EM | Admit: 2022-09-28 | Discharge: 2022-09-28 | Disposition: A | Discharge status: Home / Self Care | Payer: BC Managed Care – PPO | Attending: Emergency Medicine | Admitting: Emergency Medicine

## 2022-09-28 DIAGNOSIS — M25512 Pain in left shoulder: Secondary | ICD-10-CM | POA: Diagnosis not present

## 2022-09-28 DIAGNOSIS — R519 Headache, unspecified: Secondary | ICD-10-CM | POA: Diagnosis not present

## 2022-09-28 DIAGNOSIS — M546 Pain in thoracic spine: Secondary | ICD-10-CM | POA: Insufficient documentation

## 2022-09-28 DIAGNOSIS — M25562 Pain in left knee: Secondary | ICD-10-CM | POA: Diagnosis not present

## 2022-09-28 DIAGNOSIS — R110 Nausea: Secondary | ICD-10-CM | POA: Diagnosis not present

## 2022-09-28 DIAGNOSIS — M791 Myalgia, unspecified site: Secondary | ICD-10-CM | POA: Insufficient documentation

## 2022-09-28 DIAGNOSIS — Y92410 Unspecified street and highway as the place of occurrence of the external cause: Secondary | ICD-10-CM | POA: Diagnosis not present
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
## 2022-09-29 ENCOUNTER — Telehealth: Payer: Self-pay

## 2022-09-29 NOTE — Telephone Encounter (Signed)
Transition Care Management Follow-up Telephone Call Date of discharge and from where: 09/28/2022 [facility]  How have you been since you were released from the hospital? Pt states she has really bad headache. Neck & shoulders are still sore.  Any questions or concerns? No  Items Reviewed: Did the pt receive and understand the discharge instructions provided? Yes  Medications obtained and verified? Yes  Other? No  Any new allergies since your discharge? No  Dietary orders reviewed? Yes Do you have support at home? Yes   Home Care and Equipment/Supplies: Were home health services ordered? no If so, what is the name of the agency? N/a  Has the agency set up a time to come to the patient's home? no Were any new equipment or medical supplies ordered?  No What is the name of the medical supply agency? N/a Were you able to get the supplies/equipment? no Do you have any questions related to the use of the equipment or supplies? No  Functional Questionnaire: (I = Independent and D = Dependent) ADLs: i  Bathing/Dressing- i  Meal Prep- i  Eating- i  Maintaining continence- i  Transferring/Ambulation- i  Managing Meds- i  Follow up appointments reviewed:  PCP Hospital f/u appt confirmed? Yes  Scheduled to see janece moore on n/a @ n/a. [facility] f/u appt confirmed? No  Scheduled to see n/a on n/a @ n/a. Are transportation arrangements needed? No  If their condition worsens, is the pt aware to call PCP or go to the Emergency Dept.? Yes Was the patient provided with contact information for the PCP's office or ED? Yes Was to pt encouraged to call back with questions or concerns? Yes

## 2022-10-10 ENCOUNTER — Encounter: Payer: Self-pay | Admitting: Nurse Practitioner

## 2022-10-10 ENCOUNTER — Ambulatory Visit (INDEPENDENT_AMBULATORY_CARE_PROVIDER_SITE_OTHER): Payer: BC Managed Care – PPO | Admitting: Nurse Practitioner

## 2022-10-10 VITALS — BP 124/82 | HR 73 | Temp 97.9°F | Ht 64.0 in | Wt 238.6 lb

## 2022-10-10 DIAGNOSIS — Z1159 Encounter for screening for other viral diseases: Secondary | ICD-10-CM | POA: Diagnosis not present

## 2022-10-10 DIAGNOSIS — R030 Elevated blood-pressure reading, without diagnosis of hypertension: Secondary | ICD-10-CM

## 2022-10-10 DIAGNOSIS — M25511 Pain in right shoulder: Secondary | ICD-10-CM | POA: Diagnosis not present

## 2022-10-10 DIAGNOSIS — M25512 Pain in left shoulder: Secondary | ICD-10-CM

## 2022-10-10 DIAGNOSIS — J302 Other seasonal allergic rhinitis: Secondary | ICD-10-CM

## 2022-10-10 DIAGNOSIS — E559 Vitamin D deficiency, unspecified: Secondary | ICD-10-CM

## 2022-10-10 DIAGNOSIS — M791 Myalgia, unspecified site: Secondary | ICD-10-CM

## 2022-10-10 DIAGNOSIS — F419 Anxiety disorder, unspecified: Secondary | ICD-10-CM

## 2022-10-10 DIAGNOSIS — Z6841 Body Mass Index (BMI) 40.0 and over, adult: Secondary | ICD-10-CM

## 2022-10-10 MED ORDER — FLUTICASONE PROPIONATE 50 MCG/ACT NA SUSP: 2.0000 | Freq: Every day | NASAL | 5 refills | Status: AC

## 2022-10-10 MED ORDER — MAGNESIUM GLUCONATE 500 (27 MG) MG PO TABS: 1.0000 | ORAL_TABLET | Freq: Every day | ORAL | 1 refills | Status: AC
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
## 2022-10-11 LAB — CMP14+EGFR
ALT: 10 IU/L (ref 0–32)
AST: 18 IU/L (ref 0–40)
Albumin/Globulin Ratio: 1.4 (ref 1.2–2.2)
Albumin: 4.1 g/dL (ref 3.9–4.9)
Alkaline Phosphatase: 111 IU/L (ref 44–121)
BUN/Creatinine Ratio: 12 (ref 9–23)
BUN: 7 mg/dL (ref 6–20)
Bilirubin Total: <0.2 mg/dL (ref 0.0–1.2)
CO2: 27 mmol/L (ref 20–29)
Calcium: 9.5 mg/dL (ref 8.7–10.2)
Chloride: 102 mmol/L (ref 96–106)
Creatinine, Ser: 0.6 mg/dL (ref 0.57–1.00)
Globulin, Total: 3 g/dL (ref 1.5–4.5)
Glucose: 85 mg/dL (ref 70–99)
Potassium: 4.4 mmol/L (ref 3.5–5.2)
Sodium: 140 mmol/L (ref 134–144)
Total Protein: 7.1 g/dL (ref 6.0–8.5)
eGFR: 121 mL/min/{1.73_m2} (ref >59)

## 2022-10-11 LAB — HEPATITIS C ANTIBODY: Hep C Virus Ab: NONREACTIVE

## 2022-10-11 LAB — VITAMIN D 25 HYDROXY (VIT D DEFICIENCY, FRACTURES): Vit D, 25-Hydroxy: 12.4 ng/mL — ABNORMAL LOW (ref 30.0–100.0)

## 2022-10-15 MED ORDER — VITAMIN D (ERGOCALCIFEROL) 1.25 MG (50000 UNIT) PO CAPS: 50000.0000 [IU] | ORAL_CAPSULE | ORAL | 1 refills | Status: DC

## 2022-10-17 DIAGNOSIS — M545 Low back pain, unspecified: Secondary | ICD-10-CM | POA: Diagnosis not present

## 2022-10-17 DIAGNOSIS — M542 Cervicalgia: Secondary | ICD-10-CM | POA: Diagnosis not present

## 2022-10-17 DIAGNOSIS — T1490XA Injury, unspecified, initial encounter: Secondary | ICD-10-CM | POA: Diagnosis not present

## 2022-11-01 DIAGNOSIS — M542 Cervicalgia: Secondary | ICD-10-CM | POA: Diagnosis not present

## 2022-11-01 DIAGNOSIS — M79601 Pain in right arm: Secondary | ICD-10-CM | POA: Diagnosis not present

## 2022-11-01 DIAGNOSIS — M79602 Pain in left arm: Secondary | ICD-10-CM | POA: Diagnosis not present

## 2022-11-08 DIAGNOSIS — M79602 Pain in left arm: Secondary | ICD-10-CM | POA: Diagnosis not present

## 2022-11-08 DIAGNOSIS — M79601 Pain in right arm: Secondary | ICD-10-CM | POA: Diagnosis not present

## 2022-11-08 DIAGNOSIS — M542 Cervicalgia: Secondary | ICD-10-CM | POA: Diagnosis not present

## 2022-11-13 DIAGNOSIS — M542 Cervicalgia: Secondary | ICD-10-CM | POA: Diagnosis not present

## 2022-11-13 DIAGNOSIS — M79601 Pain in right arm: Secondary | ICD-10-CM | POA: Diagnosis not present

## 2022-11-13 DIAGNOSIS — M79602 Pain in left arm: Secondary | ICD-10-CM | POA: Diagnosis not present

## 2022-11-14 DIAGNOSIS — M25562 Pain in left knee: Secondary | ICD-10-CM | POA: Diagnosis not present

## 2022-12-01 DIAGNOSIS — M25562 Pain in left knee: Secondary | ICD-10-CM | POA: Diagnosis not present

## 2022-12-04 DIAGNOSIS — M542 Cervicalgia: Secondary | ICD-10-CM | POA: Diagnosis not present

## 2022-12-04 DIAGNOSIS — M79602 Pain in left arm: Secondary | ICD-10-CM | POA: Diagnosis not present

## 2022-12-04 DIAGNOSIS — M79601 Pain in right arm: Secondary | ICD-10-CM | POA: Diagnosis not present

## 2022-12-08 DIAGNOSIS — M79602 Pain in left arm: Secondary | ICD-10-CM | POA: Diagnosis not present

## 2022-12-08 DIAGNOSIS — M542 Cervicalgia: Secondary | ICD-10-CM | POA: Diagnosis not present

## 2022-12-08 DIAGNOSIS — M79601 Pain in right arm: Secondary | ICD-10-CM | POA: Diagnosis not present

## 2022-12-11 DIAGNOSIS — M25562 Pain in left knee: Secondary | ICD-10-CM | POA: Diagnosis not present

## 2022-12-11 DIAGNOSIS — M79602 Pain in left arm: Secondary | ICD-10-CM | POA: Diagnosis not present

## 2022-12-11 DIAGNOSIS — M79601 Pain in right arm: Secondary | ICD-10-CM | POA: Diagnosis not present

## 2022-12-11 DIAGNOSIS — M542 Cervicalgia: Secondary | ICD-10-CM | POA: Diagnosis not present

## 2022-12-12 DIAGNOSIS — T1490XA Injury, unspecified, initial encounter: Secondary | ICD-10-CM | POA: Diagnosis not present

## 2022-12-12 DIAGNOSIS — M25562 Pain in left knee: Secondary | ICD-10-CM | POA: Diagnosis not present

## 2022-12-12 DIAGNOSIS — M545 Low back pain, unspecified: Secondary | ICD-10-CM | POA: Diagnosis not present

## 2022-12-12 DIAGNOSIS — M542 Cervicalgia: Secondary | ICD-10-CM | POA: Diagnosis not present

## 2023-01-10 DIAGNOSIS — Z6835 Body mass index (BMI) 35.0-35.9, adult: Secondary | ICD-10-CM | POA: Diagnosis not present

## 2023-01-10 DIAGNOSIS — M25562 Pain in left knee: Secondary | ICD-10-CM | POA: Diagnosis not present

## 2023-01-23 ENCOUNTER — Encounter: Payer: Self-pay | Admitting: Nurse Practitioner

## 2023-01-23 DIAGNOSIS — N898 Other specified noninflammatory disorders of vagina: Secondary | ICD-10-CM | POA: Diagnosis not present

## 2023-01-23 DIAGNOSIS — Z01419 Encounter for gynecological examination (general) (routine) without abnormal findings: Secondary | ICD-10-CM | POA: Diagnosis not present

## 2023-01-23 DIAGNOSIS — Z130 Encounter for screening for diseases of the blood and blood-forming organs and certain disorders involving the immune mechanism: Secondary | ICD-10-CM | POA: Diagnosis not present

## 2023-01-23 DIAGNOSIS — R6882 Decreased libido: Secondary | ICD-10-CM | POA: Diagnosis not present

## 2023-01-23 DIAGNOSIS — Z1389 Encounter for screening for other disorder: Secondary | ICD-10-CM | POA: Diagnosis not present

## 2023-01-24 DIAGNOSIS — R6882 Decreased libido: Secondary | ICD-10-CM | POA: Diagnosis not present

## 2023-01-24 DIAGNOSIS — Z13228 Encounter for screening for other metabolic disorders: Secondary | ICD-10-CM | POA: Diagnosis not present

## 2023-01-24 DIAGNOSIS — D501 Sideropenic dysphagia: Secondary | ICD-10-CM | POA: Diagnosis not present

## 2023-01-24 DIAGNOSIS — E559 Vitamin D deficiency, unspecified: Secondary | ICD-10-CM | POA: Diagnosis not present

## 2023-01-24 DIAGNOSIS — R5382 Chronic fatigue, unspecified: Secondary | ICD-10-CM | POA: Diagnosis not present

## 2023-01-24 DIAGNOSIS — D649 Anemia, unspecified: Secondary | ICD-10-CM | POA: Diagnosis not present

## 2023-01-29 ENCOUNTER — Encounter: Payer: BC Managed Care – PPO | Admitting: Nurse Practitioner

## 2023-02-01 ENCOUNTER — Encounter: Payer: Self-pay | Admitting: Family Medicine

## 2023-02-01 ENCOUNTER — Encounter: Payer: BC Managed Care – PPO | Admitting: Family Medicine

## 2023-02-01 ENCOUNTER — Ambulatory Visit (INDEPENDENT_AMBULATORY_CARE_PROVIDER_SITE_OTHER): Payer: BC Managed Care – PPO | Admitting: Family Medicine

## 2023-02-01 VITALS — BP 120/74 | HR 76 | Temp 98.4°F | Ht 64.0 in | Wt 242.0 lb

## 2023-02-01 DIAGNOSIS — Z111 Encounter for screening for respiratory tuberculosis: Secondary | ICD-10-CM

## 2023-02-01 DIAGNOSIS — E039 Hypothyroidism, unspecified: Secondary | ICD-10-CM | POA: Diagnosis not present

## 2023-02-01 DIAGNOSIS — Z0001 Encounter for general adult medical examination with abnormal findings: Secondary | ICD-10-CM | POA: Diagnosis not present

## 2023-02-01 DIAGNOSIS — Z6841 Body Mass Index (BMI) 40.0 and over, adult: Secondary | ICD-10-CM | POA: Diagnosis not present

## 2023-02-01 DIAGNOSIS — Z0000 Encounter for general adult medical examination without abnormal findings: Secondary | ICD-10-CM

## 2023-02-01 DIAGNOSIS — F419 Anxiety disorder, unspecified: Secondary | ICD-10-CM

## 2023-02-01 NOTE — Progress Notes (Signed)
I,Jameka J Llittleton,acting as a Neurosurgeon for [employer], NP.,have documented all relevant documentation on the behalf of Rogene Meth, NP,as directed by  Inessa Wardrop Moshe Salisbury, NP while in the presence of Khori Underberg, NP.   Subjective:     Patient ID: Misty Lewis , female    DOB: 1985-10-21 , 37 y.o.   MRN: [id number]   Chief Complaint  Patient presents with   Annual Exam    HPI  Patient presents today for adult  physical. Patient is followed by [facility] for GYN care. She would like to have a TB test done for work purpose.      Past Medical History:  Diagnosis Date   Eczema    Migraine    mirena out 2015 so no longer have ha   Seasonal allergies      Family History  Problem Relation Age of Onset   Diabetes Mother    Hypertension Mother    Lupus Maternal Aunt    Lymphoma Maternal Aunt    Diabetes Maternal Aunt    Hyperlipidemia Maternal Aunt      Current Outpatient Medications:    fluticasone (FLONASE) 50 MCG/ACT nasal spray, Place 2 sprays into both nostrils daily., Disp: 18.2 g, Rfl: 5   levothyroxine (SYNTHROID) 25 MCG tablet, Take 25 mcg by mouth daily., Disp: , Rfl:    Magnesium Gluconate 500 (27 Mg) MG TABS, Take 1 tablet (500 mg total) by mouth daily., Disp: 90 tablet, Rfl: 1   Multiple Minerals (CALCIUM/MAGNESIUM/ZINC PO), Take 133 mg by mouth daily at 2 am., Disp: , Rfl:    Vitamin D, Ergocalciferol, (DRISDOL) 1.25 MG (50000 UNIT) CAPS capsule, Take 1 capsule (50,000 Units total) by mouth 2 (two) times a week., Disp: 24 capsule, Rfl: 1   Allergies  Allergen Reactions   Coconut Flavor Anaphylaxis and Nausea And Vomiting   Mushroom Extract Complex Anaphylaxis and Nausea And Vomiting    mushrooms   Cherry Nausea And Vomiting   Mustard Seed Nausea And Vomiting    Condiment- Mustard    Other Hives    olives      The patient states she uses none for birth control. Last LMP was Patient's last menstrual period was 01/27/2023.. Negative for Dysmenorrhea. Negative  for: breast discharge, breast lump(s), breast pain and breast self exam. Associated symptoms include abnormal vaginal bleeding. Pertinent negatives include abnormal bleeding (hematology), anxiety, decreased libido, depression, difficulty falling sleep, dyspareunia, history of infertility, nocturia, sexual dysfunction, sleep disturbances, urinary incontinence, urinary urgency, vaginal discharge and vaginal itching. Diet regular. Social History   Tobacco Use  Smoking Status Never  Smokeless Tobacco Never  . She has been exposed to passive smoke. The patient's alcohol use is:  Social History   Substance and Sexual Activity  Alcohol Use No  .    Review of Systems  Constitutional:  Positive for unexpected weight change.  HENT: Negative.    Eyes: Negative.   Respiratory: Negative.    Cardiovascular: Negative.   Gastrointestinal: Negative.   Endocrine: Positive for cold intolerance.  Genitourinary: Negative.   Musculoskeletal: Negative.   Skin: Negative.   Allergic/Immunologic: Negative.   Neurological: Negative.   Hematological: Negative.   Psychiatric/Behavioral: Negative.       Today's Vitals   02/01/23 1209  BP: 120/74  Pulse: 76  Temp: 98.4 F (36.9 C)  Weight: 242 lb (109.8 kg)  Height: 5\' 4"  (1.626 m)  PainSc: 0-No pain   Body mass index is 41.54 kg/m.  Wt Readings from  Last 3 Encounters:  02/01/23 242 lb (109.8 kg)  10/10/22 238 lb 9.6 oz (108.2 kg)  08/09/21 232 lb 3.2 oz (105.3 kg)     Objective:  Physical Exam Constitutional:      Appearance: Normal appearance.  Cardiovascular:     Rate and Rhythm: Normal rate and regular rhythm.     Pulses: Normal pulses.     Heart sounds: Normal heart sounds.  Pulmonary:     Effort: Pulmonary effort is normal.     Breath sounds: Normal breath sounds.  Abdominal:     General: Bowel sounds are normal.  Musculoskeletal:        General: Normal range of motion.  Skin:    General: Skin is warm and dry.  Neurological:      General: No focal deficit present.     Mental Status: She is alert and oriented to person, place, and time. Mental status is at baseline.  Psychiatric:        Mood and Affect: Mood normal.         Assessment And Plan:     1. Encounter for general adult medical examination w/o abnormal findings  2. Screening for tuberculosis - QuantiFERON-TB Gold Plus  3. Class 3 severe obesity due to excess calories with body mass index (BMI) of 40.0 to 44.9 in adult, unspecified whether serious comorbidity present (HCC)  4. Hypothyroidism, unspecified type Comments: Continue treatment regimen     Patient was given opportunity to ask questions. Patient verbalized understanding of the plan and was able to repeat key elements of the plan. All questions were answered to their satisfaction.   Dudley Cooley Moshe Salisbury, NP   I, Bret Vanessen Moshe Salisbury, NP, have reviewed all documentation for this visit. The documentation on 02/24/23 for the exam, diagnosis, procedures, and orders are all accurate and complete.   THE PATIENT IS ENCOURAGED TO PRACTICE SOCIAL DISTANCING DUE TO THE COVID-19 PANDEMIC.

## 2023-02-01 NOTE — Patient Instructions (Signed)

## 2023-02-07 ENCOUNTER — Encounter: Payer: Self-pay | Admitting: Family Medicine

## 2023-02-07 LAB — QUANTIFERON-TB GOLD PLUS
QuantiFERON Mitogen Value: >10 IU/mL
QuantiFERON Nil Value: 0.03 IU/mL
QuantiFERON TB1 Ag Value: 0.02 IU/mL
QuantiFERON TB2 Ag Value: 0.03 IU/mL
QuantiFERON-TB Gold Plus: NEGATIVE

## 2023-02-13 ENCOUNTER — Encounter: Payer: BC Managed Care – PPO | Admitting: Nurse Practitioner

## 2023-02-20 ENCOUNTER — Encounter: Payer: Self-pay | Admitting: Nurse Practitioner

## 2023-02-20 ENCOUNTER — Telehealth: Payer: Self-pay

## 2023-02-20 NOTE — Telephone Encounter (Signed)
Patient notified that her forms have been completed and are ready for pick up. Forms placed up front. YL,RMA

## 2023-02-22 DIAGNOSIS — T1490XA Injury, unspecified, initial encounter: Secondary | ICD-10-CM | POA: Diagnosis not present

## 2023-02-22 DIAGNOSIS — M542 Cervicalgia: Secondary | ICD-10-CM | POA: Diagnosis not present

## 2023-02-24 DIAGNOSIS — Z0000 Encounter for general adult medical examination without abnormal findings: Secondary | ICD-10-CM | POA: Insufficient documentation

## 2023-02-24 DIAGNOSIS — F419 Anxiety disorder, unspecified: Secondary | ICD-10-CM | POA: Insufficient documentation

## 2023-02-24 DIAGNOSIS — E039 Hypothyroidism, unspecified: Secondary | ICD-10-CM | POA: Insufficient documentation

## 2023-02-24 DIAGNOSIS — Z111 Encounter for screening for respiratory tuberculosis: Secondary | ICD-10-CM | POA: Insufficient documentation

## 2023-02-24 DIAGNOSIS — Z6841 Body Mass Index (BMI) 40.0 and over, adult: Secondary | ICD-10-CM | POA: Insufficient documentation

## 2023-05-29 ENCOUNTER — Telehealth: Payer: BC Managed Care – PPO | Admitting: Nurse Practitioner

## 2023-05-29 DIAGNOSIS — J069 Acute upper respiratory infection, unspecified: Secondary | ICD-10-CM

## 2023-05-29 NOTE — Progress Notes (Signed)
Virtual Visit Consent   Misty Lewis, you are scheduled for a virtual visit with a [facility] provider today. Just as with appointments in the office, your consent must be obtained to participate. Your consent will be active for this visit and any virtual visit you may have with one of our providers in the next 365 days. If you have a MyChart account, a copy of this consent can be sent to you electronically.  As this is a virtual visit, video technology does not allow for your provider to perform a traditional examination. This may limit your provider's ability to fully assess your condition. If your provider identifies any concerns that need to be evaluated in person or the need to arrange testing (such as labs, EKG, etc.), we will make arrangements to do so. Although advances in technology are sophisticated, we cannot ensure that it will always work on either your end or our end. If the connection with a video visit is poor, the visit may have to be switched to a telephone visit. With either a video or telephone visit, we are not always able to ensure that we have a secure connection.  By engaging in this virtual visit, you consent to the provision of healthcare and authorize for your insurance to be billed (if applicable) for the services provided during this visit. Depending on your insurance coverage, you may receive a charge related to this service.  I need to obtain your verbal consent now. Are you willing to proceed with your visit today? Misty Lewis has provided verbal consent on 05/29/2023 for a virtual visit (video or telephone). Viviano Simas, FNP  Date: 05/29/2023 7:56 AM  Virtual Visit via Video Note   I, Viviano Simas, connected with  Misty Lewis  ([id number], Feb 11, 1986) on 05/29/23 at  8:00 AM EDT by a video-enabled telemedicine application and verified that I am speaking with the correct person using two identifiers.  Location: Patient: Virtual Visit Location Patient:  Home Provider: Virtual Visit Location Provider: Home Office   I discussed the limitations of evaluation and management by telemedicine and the availability of in person appointments. The patient expressed understanding and agreed to proceed.    History of Present Illness: Misty Lewis is a 37 y.o. who identifies as a female who was assigned female at birth, and is being seen today for headache, chills, scrathy and sore throat  Symptom onset was last night around 4pm initially with a headache  Today her headache is radiating behind her eyes  She did take a COVID test and it was negative  Having some body aches as well   She has had migraines in the past but not in the past 8 years Prior to yesterday she was feeling OK no congestion or other symptoms  Her children have had some congestion without known diagnosis   She has taken Mucinex for relief   Problems:  Patient Active Problem List   Diagnosis Date Noted   Hypothyroidism 02/24/2023   Screening for tuberculosis 02/24/2023   Class 3 severe obesity due to excess calories with body mass index (BMI) of 40.0 to 44.9 in adult [facility]) 02/24/2023   Anxiety 02/24/2023   Encounter for general adult medical examination w/o abnormal findings 02/24/2023   Term pregnancy 06/17/2020   Chest discomfort 12/06/2018   Indication for care in labor or delivery 05/01/2017   SVD (spontaneous vaginal delivery) 05/01/2017    Allergies:  Allergies  Allergen Reactions   Coconut Flavor Anaphylaxis  and Nausea And Vomiting   Mushroom Extract Complex Anaphylaxis and Nausea And Vomiting    mushrooms   Cherry Nausea And Vomiting   Mustard Nausea And Vomiting    Condiment- Mustard    Other Hives    olives   Medications:  Current Outpatient Medications:    fluticasone (FLONASE) 50 MCG/ACT nasal spray, Place 2 sprays into both nostrils daily., Disp: 18.2 g, Rfl: 5   levothyroxine (SYNTHROID) 25 MCG tablet, Take 25 mcg by mouth daily., Disp: , Rfl:     Magnesium Gluconate 500 (27 Mg) MG TABS, Take 1 tablet (500 mg total) by mouth daily., Disp: 90 tablet, Rfl: 1   Multiple Minerals (CALCIUM/MAGNESIUM/ZINC PO), Take 133 mg by mouth daily at 2 am., Disp: , Rfl:    Vitamin D, Ergocalciferol, (DRISDOL) 1.25 MG (50000 UNIT) CAPS capsule, Take 1 capsule (50,000 Units total) by mouth 2 (two) times a week., Disp: 24 capsule, Rfl: 1  Observations/Objective: Patient is well-developed, well-nourished in no acute distress.  Resting comfortably  at home.  Head is normocephalic, atraumatic.  No labored breathing.  Speech is clear and coherent with logical content.  Patient is alert and oriented at baseline.    Assessment and Plan:  1. Viral upper respiratory tract infection  Treat with over the counter medications as discussed 600mg  ibuprofen for headache and pain relief (with food) every 6 hours as needed May continue Mucinex for congestion or cough  Retest for COVID tonight or tomorrow   Remain out of work/school until fever free for 24 hours   Push fluids  Rest Assure caloric intake for recover as discussed       Follow Up Instructions: I discussed the assessment and treatment plan with the patient. The patient was provided an opportunity to ask questions and all were answered. The patient agreed with the plan and demonstrated an understanding of the instructions.  A copy of instructions were sent to the patient via MyChart unless otherwise noted below.    The patient was advised to call back or seek an in-person evaluation if the symptoms worsen or if the condition fails to improve as anticipated.  Time:  I spent 12 minutes with the patient via telehealth technology discussing the above problems/concerns.    Viviano Simas, FNP

## 2023-07-02 ENCOUNTER — Ambulatory Visit: Payer: BC Managed Care – PPO | Admitting: Nurse Practitioner

## 2024-02-04 ENCOUNTER — Encounter: Payer: Self-pay | Admitting: Nurse Practitioner

## 2024-02-04 ENCOUNTER — Ambulatory Visit (INDEPENDENT_AMBULATORY_CARE_PROVIDER_SITE_OTHER): Payer: BC Managed Care – PPO | Admitting: Nurse Practitioner

## 2024-02-04 VITALS — BP 128/70 | HR 86 | Temp 98.7°F | Ht 64.0 in | Wt 249.0 lb

## 2024-02-04 DIAGNOSIS — G4489 Other headache syndrome: Secondary | ICD-10-CM

## 2024-02-04 DIAGNOSIS — E559 Vitamin D deficiency, unspecified: Secondary | ICD-10-CM | POA: Insufficient documentation

## 2024-02-04 DIAGNOSIS — Z79899 Other long term (current) drug therapy: Secondary | ICD-10-CM

## 2024-02-04 DIAGNOSIS — Z0000 Encounter for general adult medical examination without abnormal findings: Secondary | ICD-10-CM | POA: Diagnosis not present

## 2024-02-04 DIAGNOSIS — R7309 Other abnormal glucose: Secondary | ICD-10-CM | POA: Insufficient documentation

## 2024-02-04 DIAGNOSIS — E039 Hypothyroidism, unspecified: Secondary | ICD-10-CM

## 2024-02-04 NOTE — Progress Notes (Signed)
 [place], CMA,acting as a Neurosurgeon for [name], FNP.,have documented all relevant documentation on the behalf of [name], FNP,as directed by  [name], FNP while in the presence of [name], FNP.  Subjective:    Patient ID: Misty Lewis , female    DOB: [birth year] , 38 y.o.   MRN: [id number]  Chief Complaint  Patient presents with   Annual Exam    Patient presents today for HM, Patient reports compliance with medication. Patient denies any chest pain, SOB.   Pain    Patient reports she sometimes has a sharp pain in her head, she reports she has been having them on and off for a year. She reports having one yesterday and this morning that was dull.     HPI  Here today for HM. She is seeing Dr. Arlyne Lame for her gyn care. She has not been taking levothyroxine with Dr. Arlyne Lame last year but has been out since August.  Shooting pain lasting approximately 5 seconds however last night she had a dull headache for several hours and had to take tylenol .   She will have some times she has difficulty with sleeping.      Past Medical History:  Diagnosis Date   Allergy    Anxiety    Eczema    GERD (gastroesophageal reflux disease)    Migraine    mirena out 2015 so no longer have ha   Seasonal allergies      Family History  Problem Relation Age of Onset   Diabetes Mother    Hypertension Mother    COPD Mother    Drug abuse Mother    Lupus Maternal Aunt    Lymphoma Maternal Aunt    Diabetes Maternal Aunt    Hyperlipidemia Maternal Aunt    Anxiety disorder Maternal Grandmother    Hearing loss Maternal Grandmother      Current Outpatient Medications:    fluticasone  (FLONASE ) 50 MCG/ACT nasal spray, Place 2 sprays into both nostrils daily., Disp: 18.2 g, Rfl: 5   levothyroxine (SYNTHROID) 25 MCG tablet, Take 25 mcg by mouth daily., Disp: , Rfl:    Magnesium  Gluconate 500 (27 Mg) MG TABS, Take 1 tablet (500 mg total) by mouth daily., Disp: 90 tablet, Rfl: 1    Multiple Minerals (CALCIUM/MAGNESIUM /ZINC PO), Take 133 mg by mouth daily at 2 am., Disp: , Rfl:    Vitamin D , Ergocalciferol , (DRISDOL ) 1.25 MG (50000 UNIT) CAPS capsule, Take 1 capsule (50,000 Units total) by mouth 2 (two) times a week., Disp: 24 capsule, Rfl: 1   Allergies  Allergen Reactions   Coconut Flavoring Agent (Non-Screening) Anaphylaxis and Nausea And Vomiting   Mushroom Extract Complex (Obsolete) Anaphylaxis and Nausea And Vomiting    mushrooms   Cherry Nausea And Vomiting   Mustard Nausea And Vomiting    Condiment- Mustard    Other Hives    olives      The patient states she uses none for birth control. Patient's last menstrual period was 01/21/2024 (approximate).   Negative for Dysmenorrhea and Negative for Menorrhagia. Negative for: breast discharge, breast lump(s), breast pain and breast self exam. Associated symptoms include abnormal vaginal bleeding. Pertinent negatives include abnormal bleeding (hematology), anxiety, decreased libido, depression, difficulty falling sleep, dyspareunia, history of infertility, nocturia, sexual dysfunction, sleep disturbances, urinary incontinence, urinary urgency, vaginal discharge and vaginal itching. Diet: plant based.  The patient states her exercise level moderate with walking at least 30 minutes a day at least 4-5 days.  The patient's tobacco use is:  Social History   Tobacco Use  Smoking Status Never  Smokeless Tobacco Never  . She has been exposed to passive smoke. The patient's alcohol use is:  Social History   Substance and Sexual Activity  Alcohol Use No  Additional information: Last pap unsure, letter sent to Dr. Arlyne Lame for most recent PAP  Review of Systems  Constitutional: Negative.   HENT: Negative.    Eyes: Negative.   Respiratory: Negative.    Cardiovascular: Negative.   Gastrointestinal: Negative.   Endocrine: Negative.   Genitourinary: Negative.   Musculoskeletal: Negative.   Neurological:  Positive for  headaches (feeling like a shooting pain).  Psychiatric/Behavioral: Negative.       Today's Vitals   02/04/24 1033  BP: 128/70  Pulse: 86  Temp: 98.7 F (37.1 C)  TempSrc: Oral  Weight: 249 lb (112.9 kg)  Height: 5\' 4"  (1.626 m)  PainSc: 0-No pain   Body mass index is 42.74 kg/m.  Wt Readings from Last 3 Encounters:  02/04/24 249 lb (112.9 kg)  02/01/23 242 lb (109.8 kg)  10/10/22 238 lb 9.6 oz (108.2 kg)     Objective:  Physical Exam Vitals and nursing note reviewed.  Constitutional:      General: She is not in acute distress.    Appearance: Normal appearance. She is well-developed. She is obese.  HENT:     Head: Normocephalic and atraumatic.     Right Ear: Hearing, tympanic membrane, ear canal and external ear normal. There is no impacted cerumen.     Left Ear: Hearing, tympanic membrane, ear canal and external ear normal. There is no impacted cerumen.     Nose: Nose normal.     Mouth/Throat:     Mouth: Mucous membranes are moist.  Eyes:     General: Lids are normal.     Extraocular Movements: Extraocular movements intact.     Conjunctiva/sclera: Conjunctivae normal.     Pupils: Pupils are equal, round, and reactive to light.     Funduscopic exam:    Right eye: No papilledema.        Left eye: No papilledema.  Neck:     Thyroid : No thyroid  mass.     Vascular: No carotid bruit.  Cardiovascular:     Rate and Rhythm: Normal rate and regular rhythm.     Pulses: Normal pulses.     Heart sounds: Normal heart sounds. No murmur heard. Pulmonary:     Effort: Pulmonary effort is normal. No respiratory distress.     Breath sounds: Normal breath sounds. No wheezing.  Chest:     Chest wall: No mass.  Breasts:    Tanner Score is 5.     Right: Normal. No mass or tenderness.     Left: Normal. No mass or tenderness.  Abdominal:     General: Abdomen is flat. Bowel sounds are normal. There is no distension.     Palpations: Abdomen is soft.     Tenderness: There is no  abdominal tenderness.  Genitourinary:    Comments: Followed by Dr. Arlyne Lame Musculoskeletal:        General: No swelling. Normal range of motion.     Cervical back: Full passive range of motion without pain, normal range of motion and neck supple.     Right lower leg: No edema.     Left lower leg: No edema.  Lymphadenopathy:     Upper Body:     Right upper body: No  supraclavicular, axillary or pectoral adenopathy.     Left upper body: No supraclavicular, axillary or pectoral adenopathy.  Skin:    General: Skin is warm and dry.     Capillary Refill: Capillary refill takes less than 2 seconds.  Neurological:     General: No focal deficit present.     Mental Status: She is alert and oriented to person, place, and time.     Cranial Nerves: No cranial nerve deficit.     Sensory: No sensory deficit.     Motor: No weakness.  Psychiatric:        Mood and Affect: Mood normal.        Behavior: Behavior normal.        Thought Content: Thought content normal.        Judgment: Judgment normal.      Assessment And Plan:     Encounter for annual health examination Assessment & Plan: Behavior modifications discussed and diet history reviewed.   Pt will continue to exercise regularly and modify diet with low GI, plant based foods and decrease intake of processed foods.  Recommend intake of daily multivitamin, Vitamin D , and calcium.  Recommend monthly breast exams for preventive screenings, as well as recommend immunizations that include influenza, TDAP    Hypothyroidism, unspecified type Assessment & Plan: Is currently not on her levothyroxine take it last in August with her GYN.  Has not had a follow-up of her labs.  Will check thyroid  levels today  Orders: -     TSH + free T4  Vitamin D  deficiency Assessment & Plan: Will check vitamin D  level and supplement as needed.    Also encouraged to spend 15 minutes in the sun daily.    Orders: -     VITAMIN D  25 Hydroxy (Vit-D Deficiency,  Fractures)  Abnormal glucose Assessment & Plan: HgbA1c has been stable, will check A1c today.  Orders: -     CMP14+EGFR -     Hemoglobin A1c  Other headache syndrome Assessment & Plan: She has been having intermittent sharp shooting pains to her head behind her right eye. No other symptoms, will check an ESR. Intermittent migraines vs neuralgia   Orders: -     Sedimentation rate  Other long term (current) drug therapy -     CBC with Differential/Platelet    Return for 1 year physical, 3-4 month thyroid  check. Patient was given opportunity to ask questions. Patient verbalized understanding of the plan and was able to repeat key elements of the plan. All questions were answered to their satisfaction.   [name], FNP  I, [name], FNP, have reviewed all documentation for this visit. The documentation on 02/04/24 for the exam, diagnosis, procedures, and orders are all accurate and complete.

## 2024-02-04 NOTE — Assessment & Plan Note (Signed)
 Will check vitamin D level and supplement as needed.    Also encouraged to spend 15 minutes in the sun daily.

## 2024-02-04 NOTE — Assessment & Plan Note (Signed)
Behavior modifications discussed and diet history reviewed.   Pt will continue to exercise regularly and modify diet with low GI, plant based foods and decrease intake of processed foods.  Recommend intake of daily multivitamin, Vitamin D, and calcium.  Recommend monthly breast exams for preventive screenings, as well as recommend immunizations that include influenza, TDAP

## 2024-02-04 NOTE — Assessment & Plan Note (Signed)
 HgbA1c has been stable, will check A1c today.

## 2024-02-04 NOTE — Assessment & Plan Note (Addendum)
 She has been having intermittent sharp shooting pains to her head behind her right eye. No other symptoms, will check an ESR. Intermittent migraines vs neuralgia

## 2024-02-04 NOTE — Patient Instructions (Signed)
 Health Maintenance  Topic Date Due   Pap with HPV screening  Never done   COVID-19 Vaccine (1 - 2024-25 season) Never done   Flu Shot  04/25/2024   Hepatitis C Screening  Completed   HIV Screening  Completed   HPV Vaccine  Aged Out   Meningitis B Vaccine  Aged Out   DTaP/Tdap/Td vaccine  Discontinued

## 2024-02-04 NOTE — Assessment & Plan Note (Signed)
 Is currently not on her levothyroxine take it last in August with her GYN.  Has not had a follow-up of her labs.  Will check thyroid  levels today

## 2024-02-05 ENCOUNTER — Encounter: Payer: Self-pay | Admitting: Obstetrics and Gynecology

## 2024-02-05 LAB — CBC WITH DIFFERENTIAL/PLATELET
Basophils Absolute: 0 10*3/uL (ref 0.0–0.2)
Basos: 1 %
EOS (ABSOLUTE): 0.1 10*3/uL (ref 0.0–0.4)
Eos: 2 %
Hematocrit: 35.1 % (ref 34.0–46.6)
Hemoglobin: 10.8 g/dL — ABNORMAL LOW (ref 11.1–15.9)
Immature Grans (Abs): 0 10*3/uL (ref 0.0–0.1)
Immature Granulocytes: 0 %
Lymphocytes Absolute: 2.3 10*3/uL (ref 0.7–3.1)
Lymphs: 36 %
MCH: 27.1 pg (ref 26.6–33.0)
MCHC: 30.8 g/dL — ABNORMAL LOW (ref 31.5–35.7)
MCV: 88 fL (ref 79–97)
Monocytes Absolute: 0.4 10*3/uL (ref 0.1–0.9)
Monocytes: 7 %
Neutrophils Absolute: 3.5 10*3/uL (ref 1.4–7.0)
Neutrophils: 54 %
Platelets: 280 10*3/uL (ref 150–450)
RBC: 3.98 x10E6/uL (ref 3.77–5.28)
RDW: 14.1 % (ref 11.7–15.4)
WBC: 6.4 10*3/uL (ref 3.4–10.8)

## 2024-02-05 LAB — CMP14+EGFR
ALT: 15 IU/L (ref 0–32)
AST: 19 IU/L (ref 0–40)
Albumin: 4.3 g/dL (ref 3.9–4.9)
Alkaline Phosphatase: 103 IU/L (ref 44–121)
BUN/Creatinine Ratio: 11 (ref 9–23)
BUN: 8 mg/dL (ref 6–20)
Bilirubin Total: 0.3 mg/dL (ref 0.0–1.2)
CO2: 22 mmol/L (ref 20–29)
Calcium: 9.5 mg/dL (ref 8.7–10.2)
Chloride: 102 mmol/L (ref 96–106)
Creatinine, Ser: 0.71 mg/dL (ref 0.57–1.00)
Globulin, Total: 3.2 g/dL (ref 1.5–4.5)
Glucose: 94 mg/dL (ref 70–99)
Potassium: 4.1 mmol/L (ref 3.5–5.2)
Sodium: 137 mmol/L (ref 134–144)
Total Protein: 7.5 g/dL (ref 6.0–8.5)
eGFR: 114 mL/min/{1.73_m2} (ref >59)

## 2024-02-05 LAB — TSH+FREE T4
Free T4: 0.83 ng/dL (ref 0.82–1.77)
TSH: 2.5 u[IU]/mL (ref 0.450–4.500)

## 2024-02-05 LAB — HEMOGLOBIN A1C
Est. average glucose Bld gHb Est-mCnc: 120 mg/dL
Hgb A1c MFr Bld: 5.8 % — ABNORMAL HIGH (ref 4.8–5.6)

## 2024-02-05 LAB — SEDIMENTATION RATE: Sed Rate: 44 mm/h — ABNORMAL HIGH (ref 0–32)

## 2024-02-05 LAB — VITAMIN D 25 HYDROXY (VIT D DEFICIENCY, FRACTURES): Vit D, 25-Hydroxy: 26.2 ng/mL — ABNORMAL LOW (ref 30.0–100.0)

## 2024-02-20 ENCOUNTER — Ambulatory Visit: Payer: Self-pay | Admitting: Nurse Practitioner

## 2024-02-27 ENCOUNTER — Other Ambulatory Visit: Payer: Self-pay | Admitting: Nurse Practitioner

## 2024-02-27 MED ORDER — PREDNISONE 10 MG (21) PO TBPK: ORAL_TABLET | ORAL | 0 refills | Status: DC

## 2024-03-31 ENCOUNTER — Other Ambulatory Visit: Payer: Self-pay

## 2024-03-31 ENCOUNTER — Other Ambulatory Visit: Payer: Self-pay | Admitting: Nurse Practitioner

## 2024-03-31 ENCOUNTER — Encounter: Payer: Self-pay | Admitting: Nurse Practitioner

## 2024-03-31 DIAGNOSIS — Z0184 Encounter for antibody response examination: Secondary | ICD-10-CM | POA: Diagnosis not present

## 2024-03-31 DIAGNOSIS — Z111 Encounter for screening for respiratory tuberculosis: Secondary | ICD-10-CM

## 2024-06-03 ENCOUNTER — Ambulatory Visit: Admitting: Nurse Practitioner

## 2024-06-09 ENCOUNTER — Encounter: Payer: Self-pay | Admitting: Nurse Practitioner

## 2024-06-09 ENCOUNTER — Ambulatory Visit (INDEPENDENT_AMBULATORY_CARE_PROVIDER_SITE_OTHER): Admitting: Nurse Practitioner

## 2024-06-09 VITALS — BP 110/70 | HR 72 | Temp 98.6°F | Ht 64.0 in | Wt 247.2 lb

## 2024-06-09 DIAGNOSIS — E039 Hypothyroidism, unspecified: Secondary | ICD-10-CM

## 2024-06-09 DIAGNOSIS — R7309 Other abnormal glucose: Secondary | ICD-10-CM | POA: Diagnosis not present

## 2024-06-09 DIAGNOSIS — Z2821 Immunization not carried out because of patient refusal: Secondary | ICD-10-CM

## 2024-06-09 DIAGNOSIS — E66813 Obesity, class 3: Secondary | ICD-10-CM

## 2024-06-09 DIAGNOSIS — Z139 Encounter for screening, unspecified: Secondary | ICD-10-CM

## 2024-06-09 DIAGNOSIS — Z6841 Body Mass Index (BMI) 40.0 and over, adult: Secondary | ICD-10-CM

## 2024-06-09 DIAGNOSIS — E559 Vitamin D deficiency, unspecified: Secondary | ICD-10-CM | POA: Diagnosis not present

## 2024-06-09 DIAGNOSIS — R5383 Other fatigue: Secondary | ICD-10-CM | POA: Diagnosis not present

## 2024-06-09 MED ORDER — VITAMIN D (ERGOCALCIFEROL) 1.25 MG (50000 UNIT) PO CAPS: 50000.0000 [IU] | ORAL_CAPSULE | ORAL | 1 refills | Status: AC

## 2024-06-09 NOTE — Progress Notes (Signed)
 Misty Lewis, CMA,acting as a Neurosurgeon for Gaines Ada, FNP.,have documented all relevant documentation on the behalf of Gaines Ada, FNP,as directed by  Gaines Ada, FNP while in the presence of Gaines Ada, FNP.  Subjective:  Patient ID: Misty Lewis , female    DOB: 05-Apr-1986 , 38 y.o.   MRN: [id number]  Chief Complaint  Patient presents with   Hypothyroidism    Patient presents today for a thyroid   follow up, Patient reports compliance with medication. Patient denies any chest pain, SOB, or headaches. Patient has no concerns today.     HPI  She has cut back on breads and sweets. She is getting about 7,000-9,000 steps a day.      Past Medical History:  Diagnosis Date   Allergy    Anxiety    Eczema    GERD (gastroesophageal reflux disease)    Migraine    mirena out 2015 so no longer have ha   Seasonal allergies      Family History  Problem Relation Age of Onset   Diabetes Mother    Hypertension Mother    COPD Mother    Drug abuse Mother    Learning disabilities Mother    Lupus Maternal Aunt    Lymphoma Maternal Aunt    Diabetes Maternal Aunt    Hyperlipidemia Maternal Aunt    Anxiety disorder Maternal Grandmother    Hearing loss Maternal Grandmother    Depression Maternal Grandmother    Diabetes Maternal Grandfather      Current Outpatient Medications:    fluticasone  (FLONASE ) 50 MCG/ACT nasal spray, Place 2 sprays into both nostrils daily., Disp: 18.2 g, Rfl: 5   levothyroxine (SYNTHROID) 25 MCG tablet, Take 25 mcg by mouth daily., Disp: , Rfl:    Magnesium  Gluconate 500 (27 Mg) MG TABS, Take 1 tablet (500 mg total) by mouth daily., Disp: 90 tablet, Rfl: 1   Multiple Minerals (CALCIUM/MAGNESIUM /ZINC PO), Take 133 mg by mouth daily at 2 am., Disp: , Rfl:    Vitamin D , Ergocalciferol , (DRISDOL ) 1.25 MG (50000 UNIT) CAPS capsule, Take 1 capsule (50,000 Units total) by mouth 2 (two) times a week., Disp: 24 capsule, Rfl: 1   Allergies  Allergen Reactions    Coconut Flavoring Agent (Non-Screening) Anaphylaxis and Nausea And Vomiting   Mushroom Extract Complex (Obsolete) Anaphylaxis and Nausea And Vomiting    mushrooms   Cherry Nausea And Vomiting   Mustard Nausea And Vomiting    Condiment- Mustard    Other Hives    olives     Review of Systems  Constitutional: Negative.   Respiratory: Negative.    Cardiovascular: Negative.   Neurological: Negative.   Psychiatric/Behavioral: Negative.       Today's Vitals   06/09/24 1556  BP: 110/70  Pulse: 72  Temp: 98.6 F (37 C)  TempSrc: Oral  Weight: 247 lb 3.2 oz (112.1 kg)  Height: 5' 4 (1.626 m)  PainSc: 0-No pain   Body mass index is 42.43 kg/m.  Wt Readings from Last 3 Encounters:  06/09/24 247 lb 3.2 oz (112.1 kg)  02/04/24 249 lb (112.9 kg)  02/01/23 242 lb (109.8 kg)     Objective:  Physical Exam Vitals and nursing note reviewed.  Constitutional:      General: She is not in acute distress.    Appearance: Normal appearance. She is obese.  Cardiovascular:     Rate and Rhythm: Normal rate and regular rhythm.     Pulses: Normal pulses.  Heart sounds: Normal heart sounds. No murmur heard. Pulmonary:     Effort: Pulmonary effort is normal. No respiratory distress.     Breath sounds: No wheezing.  Musculoskeletal:     Cervical back: Normal range of motion. No tenderness.  Skin:    General: Skin is warm and dry.     Capillary Refill: Capillary refill takes less than 2 seconds.  Neurological:     General: No focal deficit present.     Mental Status: She is alert and oriented to person, place, and time.     Cranial Nerves: No cranial nerve deficit.     Motor: No weakness.  Psychiatric:        Mood and Affect: Mood normal.        Behavior: Behavior normal.        Thought Content: Thought content normal.        Judgment: Judgment normal.     Assessment And Plan:  Hypothyroidism, unspecified type Assessment & Plan: Will recheck thyroid  levels. She is not taking  the levothyroxine  Orders: -     TSH + free T4  Influenza vaccination declined  Class 3 severe obesity due to excess calories with body mass index (BMI) of 40.0 to 44.9 in adult, unspecified whether serious comorbidity present Assessment & Plan: She is encouraged to strive for BMI less than 30 to decrease cardiac risk. Advised to aim for at least 150 minutes of exercise per week.    Abnormal glucose Assessment & Plan: HgbA1c has been stable, will check A1c today.  Orders: -     Hemoglobin A1c  Encounter for screening -     Hepatitis B surface antibody,qualitative  Other fatigue -     Iron, TIBC and Ferritin Panel -     Vitamin B12 -     Sedimentation rate  Vitamin D  deficiency Assessment & Plan: Will check vitamin D  level and supplement as needed.    Also encouraged to spend 15 minutes in the sun daily.    Orders: -     Vitamin D  (Ergocalciferol ); Take 1 capsule (50,000 Units total) by mouth 2 (two) times a week.  Dispense: 24 capsule; Refill: 1 -     Iron, TIBC and Ferritin Panel    Return for keep SAME NEXT.  Patient was given opportunity to ask questions. Patient verbalized understanding of the plan and was able to repeat key elements of the plan. All questions were answered to their satisfaction.    Misty Gaines Ada, FNP, have reviewed all documentation for this visit. The documentation on 06/09/24 for the exam, diagnosis, procedures, and orders are all accurate and complete.   IF YOU HAVE BEEN REFERRED TO A SPECIALIST, IT MAY TAKE 1-2 WEEKS TO SCHEDULE/PROCESS THE REFERRAL. IF YOU HAVE NOT HEARD FROM US /SPECIALIST IN TWO WEEKS, PLEASE GIVE US  A CALL AT [phone].

## 2024-06-10 LAB — VITAMIN B12: Vitamin B-12: 420 pg/mL (ref 232–1245)

## 2024-06-10 LAB — HEMOGLOBIN A1C
Est. average glucose Bld gHb Est-mCnc: 123 mg/dL
Hgb A1c MFr Bld: 5.9 % — ABNORMAL HIGH (ref 4.8–5.6)

## 2024-06-10 LAB — IRON,TIBC AND FERRITIN PANEL
Ferritin: 10 ng/mL — ABNORMAL LOW (ref 15–150)
Iron Saturation: 4 % — CL (ref 15–55)
Iron: 21 ug/dL — ABNORMAL LOW (ref 27–159)
Total Iron Binding Capacity: 481 ug/dL — ABNORMAL HIGH (ref 250–450)
UIBC: 460 ug/dL — ABNORMAL HIGH (ref 131–425)

## 2024-06-10 LAB — TSH+FREE T4
Free T4: 0.83 ng/dL (ref 0.82–1.77)
TSH: 3.91 u[IU]/mL (ref 0.450–4.500)

## 2024-06-10 LAB — HEPATITIS B SURFACE ANTIBODY,QUALITATIVE

## 2024-06-10 LAB — SEDIMENTATION RATE: Sed Rate: 35 mm/h — ABNORMAL HIGH (ref 0–32)

## 2024-06-15 ENCOUNTER — Ambulatory Visit: Payer: Self-pay | Admitting: Nurse Practitioner

## 2024-06-15 DIAGNOSIS — E669 Obesity, unspecified: Secondary | ICD-10-CM | POA: Insufficient documentation

## 2024-06-15 NOTE — Assessment & Plan Note (Signed)
 Will check vitamin D  level and supplement as needed.    Also encouraged to spend 15 minutes in the sun daily.

## 2024-06-15 NOTE — Assessment & Plan Note (Signed)
 HgbA1c has been stable, will check A1c today.

## 2024-06-15 NOTE — Assessment & Plan Note (Signed)
 She is encouraged to strive for BMI less than 30 to decrease cardiac risk. Advised to aim for at least 150 minutes of exercise per week.

## 2024-06-15 NOTE — Assessment & Plan Note (Signed)
 Will recheck thyroid  levels. She is not taking the levothyroxine

## 2024-06-17 ENCOUNTER — Other Ambulatory Visit: Payer: Self-pay | Admitting: Nurse Practitioner

## 2024-06-17 DIAGNOSIS — D508 Other iron deficiency anemias: Secondary | ICD-10-CM

## 2024-06-17 DIAGNOSIS — R5383 Other fatigue: Secondary | ICD-10-CM

## 2024-06-17 MED ORDER — ACCRUFER 30 MG PO CAPS: 1.0000 | ORAL_CAPSULE | Freq: Two times a day (BID) | ORAL | 2 refills | Status: AC

## 2024-06-24 ENCOUNTER — Other Ambulatory Visit: Payer: Self-pay | Admitting: Nurse Practitioner

## 2024-06-24 MED ORDER — NOREL AD 4-10-325 MG PO TABS: 1.0000 | ORAL_TABLET | Freq: Two times a day (BID) | ORAL | 1 refills | Status: AC | PRN

## 2024-07-24 DIAGNOSIS — Z7689 Persons encountering health services in other specified circumstances: Secondary | ICD-10-CM | POA: Diagnosis not present

## 2024-07-24 DIAGNOSIS — Z13220 Encounter for screening for lipoid disorders: Secondary | ICD-10-CM | POA: Diagnosis not present

## 2024-07-24 DIAGNOSIS — R5383 Other fatigue: Secondary | ICD-10-CM | POA: Diagnosis not present

## 2024-07-24 DIAGNOSIS — R6882 Decreased libido: Secondary | ICD-10-CM | POA: Diagnosis not present

## 2024-07-24 DIAGNOSIS — L659 Nonscarring hair loss, unspecified: Secondary | ICD-10-CM | POA: Diagnosis not present

## 2025-02-04 ENCOUNTER — Encounter: Payer: Self-pay | Admitting: Nurse Practitioner
